# Patient Record
Sex: Female | Born: 1950 | Race: Black or African American | Hispanic: No | Smoking: Never smoker
Health system: Southern US, Community
[De-identification: ages and names within clinical notes are randomized; demographics above are authoritative.]

## PROBLEM LIST (undated history)

## (undated) DIAGNOSIS — I10 Essential (primary) hypertension: Secondary | ICD-10-CM

## (undated) DIAGNOSIS — G459 Transient cerebral ischemic attack, unspecified: Secondary | ICD-10-CM

## (undated) DIAGNOSIS — E785 Hyperlipidemia, unspecified: Secondary | ICD-10-CM

## (undated) DIAGNOSIS — H269 Unspecified cataract: Secondary | ICD-10-CM

## (undated) DIAGNOSIS — I5030 Unspecified diastolic (congestive) heart failure: Secondary | ICD-10-CM

## (undated) DIAGNOSIS — E119 Type 2 diabetes mellitus without complications: Secondary | ICD-10-CM

## (undated) HISTORY — PX: ABDOMINAL HYSTERECTOMY: SHX81

## (undated) HISTORY — DX: Essential (primary) hypertension: I10

## (undated) HISTORY — DX: Unspecified diastolic (congestive) heart failure: I50.30

## (undated) HISTORY — PX: EYE SURGERY: SHX253

## (undated) HISTORY — DX: Hyperlipidemia, unspecified: E78.5

## (undated) HISTORY — DX: Transient cerebral ischemic attack, unspecified: G45.9

## (undated) HISTORY — DX: Unspecified cataract: H26.9

## (undated) HISTORY — DX: Type 2 diabetes mellitus without complications: E11.9

## (undated) HISTORY — PX: TUBAL LIGATION: SHX77

---

## 2014-10-10 LAB — HM DIABETES EYE EXAM

## 2015-12-01 ENCOUNTER — Ambulatory Visit: Payer: Self-pay | Admitting: Pediatrics

## 2015-12-02 DIAGNOSIS — J810 Acute pulmonary edema: Secondary | ICD-10-CM | POA: Diagnosis not present

## 2015-12-02 DIAGNOSIS — J181 Lobar pneumonia, unspecified organism: Secondary | ICD-10-CM | POA: Diagnosis not present

## 2015-12-02 DIAGNOSIS — R0602 Shortness of breath: Secondary | ICD-10-CM | POA: Diagnosis not present

## 2015-12-02 DIAGNOSIS — E1165 Type 2 diabetes mellitus with hyperglycemia: Secondary | ICD-10-CM | POA: Diagnosis not present

## 2015-12-02 DIAGNOSIS — I110 Hypertensive heart disease with heart failure: Secondary | ICD-10-CM | POA: Diagnosis not present

## 2015-12-02 DIAGNOSIS — R609 Edema, unspecified: Secondary | ICD-10-CM | POA: Diagnosis not present

## 2015-12-02 DIAGNOSIS — Z9119 Patient's noncompliance with other medical treatment and regimen: Secondary | ICD-10-CM | POA: Diagnosis not present

## 2015-12-02 DIAGNOSIS — R0902 Hypoxemia: Secondary | ICD-10-CM | POA: Diagnosis not present

## 2015-12-02 DIAGNOSIS — Z833 Family history of diabetes mellitus: Secondary | ICD-10-CM | POA: Diagnosis not present

## 2015-12-02 DIAGNOSIS — I5031 Acute diastolic (congestive) heart failure: Secondary | ICD-10-CM | POA: Diagnosis not present

## 2015-12-02 DIAGNOSIS — R062 Wheezing: Secondary | ICD-10-CM | POA: Diagnosis not present

## 2015-12-02 DIAGNOSIS — R0682 Tachypnea, not elsewhere classified: Secondary | ICD-10-CM | POA: Diagnosis not present

## 2015-12-03 DIAGNOSIS — R0989 Other specified symptoms and signs involving the circulatory and respiratory systems: Secondary | ICD-10-CM | POA: Diagnosis not present

## 2015-12-03 DIAGNOSIS — J810 Acute pulmonary edema: Secondary | ICD-10-CM | POA: Diagnosis not present

## 2015-12-03 DIAGNOSIS — I5031 Acute diastolic (congestive) heart failure: Secondary | ICD-10-CM | POA: Diagnosis not present

## 2015-12-03 DIAGNOSIS — I509 Heart failure, unspecified: Secondary | ICD-10-CM | POA: Diagnosis not present

## 2015-12-03 DIAGNOSIS — E1165 Type 2 diabetes mellitus with hyperglycemia: Secondary | ICD-10-CM | POA: Diagnosis not present

## 2015-12-03 DIAGNOSIS — Z833 Family history of diabetes mellitus: Secondary | ICD-10-CM | POA: Diagnosis not present

## 2015-12-03 DIAGNOSIS — Z9119 Patient's noncompliance with other medical treatment and regimen: Secondary | ICD-10-CM | POA: Diagnosis not present

## 2015-12-03 DIAGNOSIS — R0902 Hypoxemia: Secondary | ICD-10-CM | POA: Diagnosis present

## 2015-12-03 DIAGNOSIS — I110 Hypertensive heart disease with heart failure: Secondary | ICD-10-CM | POA: Diagnosis not present

## 2015-12-03 DIAGNOSIS — Z8249 Family history of ischemic heart disease and other diseases of the circulatory system: Secondary | ICD-10-CM | POA: Diagnosis not present

## 2015-12-08 DIAGNOSIS — Z5321 Procedure and treatment not carried out due to patient leaving prior to being seen by health care provider: Secondary | ICD-10-CM | POA: Diagnosis not present

## 2015-12-10 ENCOUNTER — Encounter: Payer: Self-pay | Admitting: Family Medicine

## 2015-12-10 ENCOUNTER — Ambulatory Visit (INDEPENDENT_AMBULATORY_CARE_PROVIDER_SITE_OTHER): Payer: Medicare Other | Admitting: Family Medicine

## 2015-12-10 VITALS — BP 145/87 | HR 84 | Temp 97.6°F | Ht 59.5 in | Wt 154.8 lb

## 2015-12-10 DIAGNOSIS — I5031 Acute diastolic (congestive) heart failure: Secondary | ICD-10-CM | POA: Insufficient documentation

## 2015-12-10 DIAGNOSIS — I10 Essential (primary) hypertension: Secondary | ICD-10-CM | POA: Diagnosis not present

## 2015-12-10 DIAGNOSIS — E1169 Type 2 diabetes mellitus with other specified complication: Secondary | ICD-10-CM

## 2015-12-10 DIAGNOSIS — J810 Acute pulmonary edema: Secondary | ICD-10-CM | POA: Diagnosis not present

## 2015-12-10 DIAGNOSIS — K219 Gastro-esophageal reflux disease without esophagitis: Secondary | ICD-10-CM | POA: Insufficient documentation

## 2015-12-10 DIAGNOSIS — E119 Type 2 diabetes mellitus without complications: Secondary | ICD-10-CM | POA: Insufficient documentation

## 2015-12-10 DIAGNOSIS — K2100 Gastro-esophageal reflux disease with esophagitis, without bleeding: Secondary | ICD-10-CM

## 2015-12-10 DIAGNOSIS — I152 Hypertension secondary to endocrine disorders: Secondary | ICD-10-CM | POA: Insufficient documentation

## 2015-12-10 DIAGNOSIS — E1159 Type 2 diabetes mellitus with other circulatory complications: Secondary | ICD-10-CM | POA: Insufficient documentation

## 2015-12-10 MED ORDER — RANITIDINE HCL 150 MG PO CAPS: 150.0000 mg | ORAL_CAPSULE | Freq: Two times a day (BID) | ORAL | 2 refills | Status: DC

## 2015-12-10 MED ORDER — AMLODIPINE BESYLATE 5 MG PO TABS: 5.0000 mg | ORAL_TABLET | Freq: Every day | ORAL | 3 refills | Status: DC

## 2015-12-10 MED ORDER — AMLODIPINE BESYLATE 5 MG PO TABS: 5.0000 mg | ORAL_TABLET | Freq: Every day | ORAL | 2 refills | Status: DC

## 2015-12-10 MED ORDER — FUROSEMIDE 20 MG PO TABS: 20.0000 mg | ORAL_TABLET | Freq: Every day | ORAL | 3 refills | Status: DC

## 2015-12-10 NOTE — Patient Instructions (Signed)
Use  Ranitidine  twice daily 30 minutes before meal. Over the counter for GERD

## 2015-12-10 NOTE — Progress Notes (Signed)
BP (!) 145/87 (BP Location: Left Arm, Cuff Size: Large)   Pulse 84   Temp 97.6 F (36.4 C) (Oral)   Ht 4' 11.5" (1.511 m)   Wt 154 lb 12.8 oz (70.2 kg)   BMI 30.74 kg/m    Subjective:    Patient ID: Christy Love, female    DOB: 04/17/1951, 65 y.o.   MRN: [id number]  HPI: Christy Love is a 65 y.o. female presenting on 12/10/2015 for Hospitalization Follow-up (patient was discharged with Lasix, K-Dur, Lantus, Norvasc and sliding scale insulin.  She could not start these medications because of cost.  ) and Gastroesophageal Reflux   HPI Hospital follow-up Patient is coming in for hospital follow-up for acute pulmonary edema and acute diastolic heart failure. She had never had either these before but she has been off of her blood pressure medications and diabetes medications and they have been out of control for at least the past 8 or 9 months. She went into the hospital with shortness of breath and swelling. In the hospital they diagnosed her with the pulmonary edema and diastolic heart failure and perform diuresis after which her breathing returned to baseline. They did an echocardiogram and showed that after the diuresis her heart was working pretty much normally and showed no more diastolic dysfunction. They also restarted her medications for diabetes and hypertension as they were out of control. Currently today she denies any shortness of breath or wheezing or coughing since she got out of the hospital. She was unable to pick up some of her medications because of cost but some of that is because they sent the name brand instead of the generic and we will send the generic for today.  GERD Patient got out of the hospital and has been having increased heartburn but this is something that she has chronically. She used to be on medications for it but is not currently. She denies any blood in her stool or nausea or vomiting. She thinks she used to be on Nexium previously but cannot  remember.  Relevant past medical, surgical, family and social history reviewed and updated as indicated. Interim medical history since our last visit reviewed. Allergies and medications reviewed and updated.  Review of Systems  Constitutional: Negative for chills and fever.  HENT: Negative for congestion, ear discharge and ear pain.   Eyes: Negative for redness and visual disturbance.  Respiratory: Negative for cough, chest tightness, shortness of breath and wheezing.   Cardiovascular: Negative for chest pain and leg swelling.  Genitourinary: Negative for difficulty urinating and dysuria.  Musculoskeletal: Negative for back pain and gait problem.  Skin: Negative for rash.  Neurological: Negative for dizziness, weakness, light-headedness, numbness and headaches.  Psychiatric/Behavioral: Negative for agitation and behavioral problems.  All other systems reviewed and are negative.   Per HPI unless specifically indicated above  Social History   Social History  . Marital status: Widowed    Spouse name: N/A  . Number of children: N/A  . Years of education: N/A   Occupational History  . Not on file.   Social History Main Topics  . Smoking status: Never Smoker  . Smokeless tobacco: Never Used  . Alcohol use Yes     Comment: occasional socially  . Drug use: No  . Sexual activity: No   Other Topics Concern  . Not on file   Social History Narrative  . No narrative on file    Past Surgical History:  Procedure Laterality Date  .  ABDOMINAL HYSTERECTOMY    . TUBAL LIGATION      Family History  Problem Relation Age of Onset  . Diabetes Mother   . Glaucoma Mother   . Dementia Mother   . Diabetes Father   . Heart disease Father   . Diabetes Sister   . Diabetes Brother       Medication List       Accurate as of 12/10/15 10:03 AM. Always use your most recent med list.          amLODipine 5 MG tablet Commonly known as:  NORVASC Take 1 tablet (5 mg total) by mouth  daily.   Biotin 10000 MCG Tabs Take 10,000 mcg by mouth daily.   furosemide 20 MG tablet Commonly known as:  LASIX Take 1 tablet (20 mg total) by mouth daily.   lisinopril 10 MG tablet Commonly known as:  PRINIVIL,ZESTRIL Take 10 mg by mouth daily.   metFORMIN 1000 MG tablet Commonly known as:  GLUCOPHAGE Take 1,000 mg by mouth 2 (two) times daily with a meal.   ranitidine 150 MG capsule Commonly known as:  ZANTAC Take 1 capsule (150 mg total) by mouth 2 (two) times daily.   VITAMIN B COMPLEX PO Take 1 tablet by mouth daily.          Objective:    BP (!) 145/87 (BP Location: Left Arm, Cuff Size: Large)   Pulse 84   Temp 97.6 F (36.4 C) (Oral)   Ht 4' 11.5" (1.511 m)   Wt 154 lb 12.8 oz (70.2 kg)   BMI 30.74 kg/m   Wt Readings from Last 3 Encounters:  12/10/15 154 lb 12.8 oz (70.2 kg)    Physical Exam  Constitutional: She is oriented to person, place, and time. She appears well-developed and well-nourished. No distress.  HENT:  Right Ear: External ear normal.  Left Ear: External ear normal.  Nose: Nose normal.  Mouth/Throat: Oropharynx is clear and moist. No oropharyngeal exudate.  Eyes: Conjunctivae and EOM are normal. Pupils are equal, round, and reactive to light.  Neck: Neck supple. No thyromegaly present.  Cardiovascular: Normal rate, regular rhythm, normal heart sounds and intact distal pulses.   No murmur heard. Pulmonary/Chest: Effort normal. No respiratory distress. She has no wheezes. She has rales (By basilar crackles at the very bottom).  Abdominal: Soft. Bowel sounds are normal. She exhibits no distension. There is no tenderness. There is no rebound and no guarding.  Musculoskeletal: Normal range of motion. She exhibits no edema or tenderness.  Lymphadenopathy:    She has no cervical adenopathy.  Neurological: She is alert and oriented to person, place, and time. Coordination normal.  Skin: Skin is warm and dry. No rash noted. She is not  diaphoretic.  Psychiatric: She has a normal mood and affect. Her behavior is normal.  Nursing note and vitals reviewed.   No results found for this or any previous visit.    Assessment & Plan:   Problem List Items Addressed This Visit      Cardiovascular and Mediastinum   Essential hypertension   Relevant Medications   lisinopril (PRINIVIL,ZESTRIL) 10 MG tablet   amLODipine (NORVASC) 5 MG tablet   furosemide (LASIX) 20 MG tablet   Other Relevant Orders   UXL24+MWNU   Acute diastolic heart failure (HCC)   Relevant Medications   lisinopril (PRINIVIL,ZESTRIL) 10 MG tablet   amLODipine (NORVASC) 5 MG tablet   furosemide (LASIX) 20 MG tablet   Other Relevant Orders  CMP14+EGFR     Digestive   GERD (gastroesophageal reflux disease)   Relevant Medications   ranitidine (ZANTAC) 150 MG capsule     Endocrine   Type 2 diabetes mellitus (HCC)   Relevant Medications   metFORMIN (GLUCOPHAGE) 1000 MG tablet   lisinopril (PRINIVIL,ZESTRIL) 10 MG tablet   Other Relevant Orders   Ambulatory referral to Ophthalmology   CMP14+EGFR    Other Visit Diagnoses    Acute pulmonary edema (HCC)    -  Primary   Relevant Medications   furosemide (LASIX) 20 MG tablet       Follow up plan: Return in about 2 weeks (around 12/24/2015), or if symptoms worsen or fail to improve, for Recheck breathing and hypertension and diabetes.  Caryl Pina, MD [facility] 12/10/2015, 10:03 AM

## 2015-12-11 LAB — CMP14+EGFR
ALT: 21 IU/L (ref 0–32)
AST: 18 IU/L (ref 0–40)
Albumin/Globulin Ratio: 1.3 (ref 1.2–2.2)
Albumin: 4.1 g/dL (ref 3.6–4.8)
Alkaline Phosphatase: 76 IU/L (ref 39–117)
BUN/Creatinine Ratio: 14 (ref 12–28)
BUN: 12 mg/dL (ref 8–27)
Bilirubin Total: <0.2 mg/dL (ref 0.0–1.2)
CALCIUM: 9.7 mg/dL (ref 8.7–10.3)
CO2: 19 mmol/L (ref 18–29)
CREATININE: 0.84 mg/dL (ref 0.57–1.00)
Chloride: 97 mmol/L (ref 96–106)
GFR calc Af Amer: 84 mL/min/{1.73_m2} (ref >59)
GFR, EST NON AFRICAN AMERICAN: 73 mL/min/{1.73_m2} (ref >59)
GLOBULIN, TOTAL: 3.2 g/dL (ref 1.5–4.5)
Glucose: 169 mg/dL — ABNORMAL HIGH (ref 65–99)
Potassium: 4.5 mmol/L (ref 3.5–5.2)
Sodium: 138 mmol/L (ref 134–144)
TOTAL PROTEIN: 7.3 g/dL (ref 6.0–8.5)

## 2015-12-15 ENCOUNTER — Telehealth: Payer: Self-pay | Admitting: Family Medicine

## 2015-12-23 ENCOUNTER — Encounter: Payer: Self-pay | Admitting: Family Medicine

## 2015-12-24 ENCOUNTER — Ambulatory Visit (INDEPENDENT_AMBULATORY_CARE_PROVIDER_SITE_OTHER): Payer: Medicare Other | Admitting: Family Medicine

## 2015-12-24 ENCOUNTER — Encounter: Payer: Self-pay | Admitting: Family Medicine

## 2015-12-24 VITALS — BP 133/89 | HR 91 | Temp 98.3°F | Ht 59.5 in | Wt 153.6 lb

## 2015-12-24 DIAGNOSIS — I5031 Acute diastolic (congestive) heart failure: Secondary | ICD-10-CM

## 2015-12-24 DIAGNOSIS — E1169 Type 2 diabetes mellitus with other specified complication: Secondary | ICD-10-CM | POA: Diagnosis not present

## 2015-12-24 DIAGNOSIS — K2100 Gastro-esophageal reflux disease with esophagitis, without bleeding: Secondary | ICD-10-CM

## 2015-12-24 DIAGNOSIS — I10 Essential (primary) hypertension: Secondary | ICD-10-CM

## 2015-12-24 LAB — BAYER DCA HB A1C WAIVED: HB A1C (BAYER DCA - WAIVED): 12.5 % — ABNORMAL HIGH (ref <7.0)

## 2015-12-24 MED ORDER — EMPAGLIFLOZIN 10 MG PO TABS: 10.0000 mg | ORAL_TABLET | Freq: Every day | ORAL | 2 refills | Status: DC

## 2015-12-24 MED ORDER — OMEPRAZOLE 20 MG PO CPDR: 20.0000 mg | DELAYED_RELEASE_CAPSULE | Freq: Every day | ORAL | 3 refills | Status: DC

## 2015-12-24 NOTE — Progress Notes (Signed)
BP 133/89 (BP Location: Left Arm, Patient Position: Sitting, Cuff Size: Normal)   Pulse 91   Temp 98.3 F (36.8 C) (Oral)   Ht 4' 11.5" (1.511 m)   Wt 153 lb 9.6 oz (69.7 kg)   BMI 30.50 kg/m    Subjective:    Patient ID: Christy Love, female    DOB: 05/14/1950, 65 y.o.   MRN: [id number]  HPI: Christy Love is a 66 y.o. female presenting on 12/24/2015 for Hypertension (followup); Diabetes; and Pulmonary Edema   HPI Hypertension follow-up Patient comes in for hypertension recheck today. Her blood pressure is 133/89. She is currently on Norvasc and lisinopril. Patient denies headaches, blurred vision, chest pains, shortness of breath, or weakness. Denies any side effects from medication and is content with current medication.   Type 2 diabetes recheck Patient comes in for recheck on her diabetes. She says her blood sugars are keeping closer to the low 200s and below and in the a.m. they have been closer to the 130s to 160s. Her hemoglobin A1c 1 month ago was 12.8 and today it is 12.5. She is currently on an ACE inhibitor. She is not currently on a statin. She has not yet seen an ophthalmologist this year.  GERD Patient has been on ranitidine 150 mg twice a day for the past couple weeks and does not feel like it's helping her with her acid reflux. She is still having early satiety and some nausea. She denies any blood in her stool. She denies any abdominal pain or pain radiating anywhere else.  Follow-up on breathing and acute heart failure from the hospital Patient is coming back for an early recheck on her breathing to make sure that nothing has returned from her acute heart failure visit. When she saw me last time she was not having more shortness of breath or swelling. On her return visit this time she is still not having any of that and still doing very well and that regards.  Relevant past medical, surgical, family and social history reviewed and updated as indicated.  Interim medical history since our last visit reviewed. Allergies and medications reviewed and updated.  Review of Systems  Constitutional: Negative for chills and fever.  HENT: Negative for congestion, ear discharge and ear pain.   Eyes: Negative for redness and visual disturbance.  Respiratory: Negative for cough, chest tightness, shortness of breath and wheezing.   Cardiovascular: Negative for chest pain, palpitations and leg swelling.  Gastrointestinal: Positive for nausea. Negative for abdominal pain, blood in stool, constipation, diarrhea and vomiting.  Genitourinary: Negative for difficulty urinating and dysuria.  Musculoskeletal: Negative for back pain and gait problem.  Skin: Negative for rash.  Neurological: Negative for light-headedness and headaches.  Psychiatric/Behavioral: Negative for agitation and behavioral problems.  All other systems reviewed and are negative.   Per HPI unless specifically indicated above     Medication List       Accurate as of 12/24/15  5:03 PM. Always use your most recent med list.          amLODipine 5 MG tablet Commonly known as:  NORVASC Take 1 tablet (5 mg total) by mouth daily.   Biotin 10000 MCG Tabs Take 10,000 mcg by mouth daily.   empagliflozin 10 MG Tabs tablet Commonly known as:  JARDIANCE Take 10 mg by mouth daily.   furosemide 20 MG tablet Commonly known as:  LASIX Take 1 tablet (20 mg total) by mouth daily.   lisinopril  10 MG tablet Commonly known as:  PRINIVIL,ZESTRIL Take 10 mg by mouth daily.   metFORMIN 1000 MG tablet Commonly known as:  GLUCOPHAGE Take 1,000 mg by mouth 2 (two) times daily with a meal.   omeprazole 20 MG capsule Commonly known as:  PRILOSEC Take 1 capsule (20 mg total) by mouth daily.   ranitidine 150 MG capsule Commonly known as:  ZANTAC Take 1 capsule (150 mg total) by mouth 2 (two) times daily.   VITAMIN B COMPLEX PO Take 1 tablet by mouth daily.          Objective:    BP  133/89 (BP Location: Left Arm, Patient Position: Sitting, Cuff Size: Normal)   Pulse 91   Temp 98.3 F (36.8 C) (Oral)   Ht 4' 11.5" (1.511 m)   Wt 153 lb 9.6 oz (69.7 kg)   BMI 30.50 kg/m   Wt Readings from Last 3 Encounters:  12/24/15 153 lb 9.6 oz (69.7 kg)  12/10/15 154 lb 12.8 oz (70.2 kg)    Physical Exam  Constitutional: She is oriented to person, place, and time. She appears well-developed and well-nourished. No distress.  Eyes: Conjunctivae are normal.  Neck: Neck supple. No thyromegaly present.  Cardiovascular: Normal rate, regular rhythm, normal heart sounds and intact distal pulses.   No murmur heard. Pulmonary/Chest: Effort normal and breath sounds normal. No respiratory distress. She has no wheezes. She has no rales.  Abdominal: Soft. Bowel sounds are normal. She exhibits no distension. There is no tenderness. There is no rebound and no guarding.  Musculoskeletal: Normal range of motion. She exhibits no edema or tenderness.  Lymphadenopathy:    She has no cervical adenopathy.  Neurological: She is alert and oriented to person, place, and time. Coordination normal.  Skin: Skin is warm and dry. No rash noted. She is not diaphoretic.  Psychiatric: She has a normal mood and affect. Her behavior is normal.  Nursing note and vitals reviewed.      Assessment & Plan:   Problem List Items Addressed This Visit      Cardiovascular and Mediastinum   Essential hypertension - Primary    Continue current medications      Relevant Orders   CMP14+EGFR (Completed)   Acute diastolic heart failure (HCC)    Symptoms resolved, we'll continue to monitor for now        Digestive   GERD (gastroesophageal reflux disease)   Relevant Medications   omeprazole (PRILOSEC) 20 MG capsule   Other Relevant Orders   Ambulatory referral to Gastroenterology     Endocrine   Type 2 diabetes mellitus ([place])    We'll try and add an SGLT 2 medication and see if patient can afford one of  them.      Relevant Medications   empagliflozin (JARDIANCE) 10 MG TABS tablet   canagliflozin (INVOKANA) 100 MG TABS tablet   Other Relevant Orders   Bayer DCA Hb A1c Waived (Completed)   Lipid panel (Completed)    Other Visit Diagnoses   None.      Follow up plan: Return in about 3 months (around 03/25/2016), or if symptoms worsen or fail to improve, for Recheck diabetes and hypertension.  Counseling provided for all of the vaccine components Orders Placed This Encounter  Procedures  . Bayer DCA Hb A1c Waived  . CMP14+EGFR  . Lipid panel    Caryl Pina, MD [facility] 12/24/2015, 5:03 PM

## 2015-12-25 LAB — CMP14+EGFR
ALK PHOS: 65 IU/L (ref 39–117)
ALT: 13 IU/L (ref 0–32)
AST: 14 IU/L (ref 0–40)
Albumin/Globulin Ratio: 1.6 (ref 1.2–2.2)
Albumin: 4.7 g/dL (ref 3.6–4.8)
BILIRUBIN TOTAL: 0.2 mg/dL (ref 0.0–1.2)
BUN / CREAT RATIO: 15 (ref 12–28)
BUN: 13 mg/dL (ref 8–27)
CHLORIDE: 102 mmol/L (ref 96–106)
CO2: 25 mmol/L (ref 18–29)
CREATININE: 0.88 mg/dL (ref 0.57–1.00)
Calcium: 10.1 mg/dL (ref 8.7–10.3)
GFR calc Af Amer: 80 mL/min/{1.73_m2} (ref >59)
GFR calc non Af Amer: 69 mL/min/{1.73_m2} (ref >59)
Globulin, Total: 2.9 g/dL (ref 1.5–4.5)
Glucose: 122 mg/dL — ABNORMAL HIGH (ref 65–99)
Potassium: 4.6 mmol/L (ref 3.5–5.2)
Sodium: 144 mmol/L (ref 134–144)
Total Protein: 7.6 g/dL (ref 6.0–8.5)

## 2015-12-25 LAB — LIPID PANEL
CHOLESTEROL TOTAL: 208 mg/dL — AB (ref 100–199)
Chol/HDL Ratio: 2.5 ratio units (ref 0.0–4.4)
HDL: 83 mg/dL (ref >39)
LDL CALC: 96 mg/dL (ref 0–99)
TRIGLYCERIDES: 143 mg/dL (ref 0–149)
VLDL CHOLESTEROL CAL: 29 mg/dL (ref 5–40)

## 2015-12-25 MED ORDER — CANAGLIFLOZIN 100 MG PO TABS: 100.0000 mg | ORAL_TABLET | Freq: Every day | ORAL | 2 refills | Status: DC

## 2015-12-27 NOTE — Assessment & Plan Note (Signed)
We'll try and add an SGLT 2 medication and see if patient can afford one of them.

## 2015-12-27 NOTE — Assessment & Plan Note (Signed)
Symptoms resolved, we'll continue to monitor for now

## 2015-12-27 NOTE — Assessment & Plan Note (Signed)
Continue current medications. 

## 2016-01-05 ENCOUNTER — Encounter: Payer: Self-pay | Admitting: Gastroenterology

## 2016-01-26 ENCOUNTER — Ambulatory Visit: Payer: Self-pay | Admitting: Gastroenterology

## 2016-02-23 ENCOUNTER — Telehealth: Payer: Self-pay | Admitting: Family Medicine

## 2016-02-24 NOTE — Telephone Encounter (Signed)
lmtcb

## 2016-03-07 ENCOUNTER — Telehealth: Payer: Self-pay | Admitting: Family Medicine

## 2016-03-07 NOTE — Telephone Encounter (Signed)
Notes faxed.

## 2016-03-28 ENCOUNTER — Ambulatory Visit: Payer: Medicare Other | Admitting: Family Medicine

## 2016-07-08 ENCOUNTER — Telehealth: Payer: Self-pay | Admitting: Pharmacist

## 2016-07-08 NOTE — Telephone Encounter (Signed)
Patient is due to have DM rechecked and to follow up with PCP.  Called to schedule appt but No Ans - left message for patient to call for appt.

## 2016-10-04 ENCOUNTER — Telehealth: Payer: Self-pay | Admitting: Family Medicine

## 2016-10-21 DIAGNOSIS — E1165 Type 2 diabetes mellitus with hyperglycemia: Secondary | ICD-10-CM | POA: Diagnosis not present

## 2016-10-21 DIAGNOSIS — Z9114 Patient's other noncompliance with medication regimen: Secondary | ICD-10-CM | POA: Diagnosis not present

## 2016-10-21 DIAGNOSIS — Z79899 Other long term (current) drug therapy: Secondary | ICD-10-CM | POA: Diagnosis not present

## 2016-10-21 DIAGNOSIS — R200 Anesthesia of skin: Secondary | ICD-10-CM | POA: Diagnosis not present

## 2016-10-21 DIAGNOSIS — R531 Weakness: Secondary | ICD-10-CM | POA: Diagnosis not present

## 2016-10-21 DIAGNOSIS — I639 Cerebral infarction, unspecified: Secondary | ICD-10-CM | POA: Diagnosis not present

## 2016-10-21 DIAGNOSIS — Z7984 Long term (current) use of oral hypoglycemic drugs: Secondary | ICD-10-CM | POA: Diagnosis not present

## 2016-10-21 DIAGNOSIS — I10 Essential (primary) hypertension: Secondary | ICD-10-CM | POA: Diagnosis not present

## 2016-10-23 DIAGNOSIS — R531 Weakness: Secondary | ICD-10-CM | POA: Diagnosis not present

## 2016-10-23 DIAGNOSIS — E119 Type 2 diabetes mellitus without complications: Secondary | ICD-10-CM | POA: Diagnosis not present

## 2016-10-23 DIAGNOSIS — I10 Essential (primary) hypertension: Secondary | ICD-10-CM | POA: Diagnosis not present

## 2016-10-23 DIAGNOSIS — Z7984 Long term (current) use of oral hypoglycemic drugs: Secondary | ICD-10-CM | POA: Diagnosis not present

## 2016-10-23 DIAGNOSIS — I160 Hypertensive urgency: Secondary | ICD-10-CM | POA: Diagnosis not present

## 2016-10-23 DIAGNOSIS — Z79899 Other long term (current) drug therapy: Secondary | ICD-10-CM | POA: Diagnosis not present

## 2016-10-23 DIAGNOSIS — Z7902 Long term (current) use of antithrombotics/antiplatelets: Secondary | ICD-10-CM | POA: Diagnosis not present

## 2016-10-23 DIAGNOSIS — R930 Abnormal findings on diagnostic imaging of skull and head, not elsewhere classified: Secondary | ICD-10-CM | POA: Diagnosis not present

## 2016-10-25 ENCOUNTER — Encounter: Payer: Self-pay | Admitting: Family Medicine

## 2016-10-25 ENCOUNTER — Ambulatory Visit (INDEPENDENT_AMBULATORY_CARE_PROVIDER_SITE_OTHER): Payer: PPO | Admitting: Family Medicine

## 2016-10-25 VITALS — BP 136/85 | HR 91 | Temp 97.6°F | Ht 59.5 in | Wt 152.0 lb

## 2016-10-25 DIAGNOSIS — I10 Essential (primary) hypertension: Secondary | ICD-10-CM

## 2016-10-25 DIAGNOSIS — Z8673 Personal history of transient ischemic attack (TIA), and cerebral infarction without residual deficits: Secondary | ICD-10-CM | POA: Diagnosis not present

## 2016-10-25 DIAGNOSIS — E1169 Type 2 diabetes mellitus with other specified complication: Secondary | ICD-10-CM

## 2016-10-25 DIAGNOSIS — Z1159 Encounter for screening for other viral diseases: Secondary | ICD-10-CM | POA: Diagnosis not present

## 2016-10-25 DIAGNOSIS — Z780 Asymptomatic menopausal state: Secondary | ICD-10-CM

## 2016-10-25 LAB — BAYER DCA HB A1C WAIVED: HB A1C (BAYER DCA - WAIVED): 10.2 % — ABNORMAL HIGH (ref <7.0)

## 2016-10-25 MED ORDER — LISINOPRIL 10 MG PO TABS: 10.0000 mg | ORAL_TABLET | Freq: Every day | ORAL | 1 refills | Status: DC

## 2016-10-25 MED ORDER — METFORMIN HCL 1000 MG PO TABS: 1000.0000 mg | ORAL_TABLET | Freq: Two times a day (BID) | ORAL | 3 refills | Status: DC

## 2016-10-25 MED ORDER — AMLODIPINE BESYLATE 5 MG PO TABS: 5.0000 mg | ORAL_TABLET | Freq: Every day | ORAL | 3 refills | Status: DC

## 2016-10-25 MED ORDER — EMPAGLIFLOZIN 10 MG PO TABS: 10.0000 mg | ORAL_TABLET | Freq: Every day | ORAL | 2 refills | Status: DC

## 2016-10-25 MED ORDER — AMLODIPINE BESYLATE 5 MG PO TABS: 5.0000 mg | ORAL_TABLET | Freq: Every day | ORAL | 1 refills | Status: DC

## 2016-10-25 MED ORDER — CLOPIDOGREL BISULFATE 75 MG PO TABS: 75.0000 mg | ORAL_TABLET | Freq: Every day | ORAL | 3 refills | Status: DC

## 2016-10-25 NOTE — Progress Notes (Signed)
BP 136/85   Pulse 91   Temp 97.6 F (36.4 C) (Oral)   Ht 4' 11.5" (1.511 m)   Wt 152 lb (68.9 kg)   BMI 30.19 kg/m    Subjective:    Patient ID: Christy Love, female    DOB: Apr 29, 1951, 66 y.o.   MRN: [id number]  HPI: Christy Love is a 66 y.o. female presenting on 10/25/2016 for ER followup (2 visits last week, stroke like symptoms; patient had not been taking any medications since September, started them again last Wednesday)   HPI Hospital/ER follow-up for TIAs Patient is coming in for hospital/ER follow-up for recurrent TIAs over the past week. She thinks she has had about 6 different episodes where she had numbness and weakness on the left side of her body felt like she is dragging her left foot and some dizziness associated with it. She went in 4 days ago negative CT scan and did not find anything and her symptoms had completely cleared after 1 minute after beginning. Then she went in again yesterday and they found the same findings. We are requesting those records. She does admit that she had gone off her medications for diabetes and blood pressure and her blood pressure was 200/120 and higher in the emergency department. They restarted her on her medications which she is taking over the past 4 days. She has not had any episodes of this today. They did start her on Plavix as well. She is coming with [place] to reestablish for hypertension and diabetes as well as we will check to see where those are. Patient is also due for a DEXA scan today. During the TIA she also had some visual disturbances well but SYMPTOMS are completely normal about 1 minute after each time that it happened.  Relevant past medical, surgical, family and social history reviewed and updated as indicated. Interim medical history since our last visit reviewed. Allergies and medications reviewed and updated.  Review of Systems  Constitutional: Negative for chills and fever.  HENT: Negative for congestion, ear  discharge and ear pain.   Eyes: Positive for visual disturbance. Negative for redness.  Respiratory: Negative for chest tightness and shortness of breath.   Cardiovascular: Negative for chest pain and leg swelling.  Genitourinary: Negative for difficulty urinating and dysuria.  Musculoskeletal: Negative for back pain and gait problem.  Skin: Negative for rash.  Neurological: Positive for weakness and numbness. Negative for dizziness, light-headedness and headaches.  Psychiatric/Behavioral: Negative for agitation and behavioral problems.  All other systems reviewed and are negative.   Per HPI unless specifically indicated above   Allergies as of 10/25/2016   No Known Allergies     Medication List       Accurate as of 10/25/16  1:52 PM. Always use your most recent med list.          amLODipine 5 MG tablet Commonly known as:  NORVASC Take 1 tablet (5 mg total) by mouth daily.   Biotin 10000 MCG Tabs Take 10,000 mcg by mouth daily.   clopidogrel 75 MG tablet Commonly known as:  PLAVIX Take 1 tablet (75 mg total) by mouth daily.   empagliflozin 10 MG Tabs tablet Commonly known as:  JARDIANCE Take 10 mg by mouth daily.   lisinopril 10 MG tablet Commonly known as:  PRINIVIL,ZESTRIL Take 1 tablet (10 mg total) by mouth daily.   metFORMIN 1000 MG tablet Commonly known as:  GLUCOPHAGE Take 1 tablet (1,000 mg total) by mouth  2 (two) times daily with a meal.   VITAMIN B COMPLEX PO Take 1 tablet by mouth daily.          Objective:    BP 136/85   Pulse 91   Temp 97.6 F (36.4 C) (Oral)   Ht 4' 11.5" (1.511 m)   Wt 152 lb (68.9 kg)   BMI 30.19 kg/m   Wt Readings from Last 3 Encounters:  10/25/16 152 lb (68.9 kg)  12/24/15 153 lb 9.6 oz (69.7 kg)  12/10/15 154 lb 12.8 oz (70.2 kg)    Physical Exam  Constitutional: She is oriented to person, place, and time. She appears well-developed and well-nourished. No distress.  HENT:  Right Ear: External ear normal.    Left Ear: External ear normal.  Nose: Nose normal.  Mouth/Throat: Oropharynx is clear and moist.  Eyes: Conjunctivae and EOM are normal. Pupils are equal, round, and reactive to light. Right eye exhibits no discharge. Left eye exhibits no discharge. No scleral icterus.  Neck: Neck supple. No thyromegaly present.  Cardiovascular: Normal rate, regular rhythm, normal heart sounds and intact distal pulses.   No murmur heard. Pulmonary/Chest: Effort normal and breath sounds normal. No respiratory distress. She has no wheezes. She has no rales.  Musculoskeletal: Normal range of motion. She exhibits no edema or tenderness.  Lymphadenopathy:    She has no cervical adenopathy.  Neurological: She is alert and oriented to person, place, and time. She displays no atrophy and normal reflexes. No cranial nerve deficit or sensory deficit. She exhibits normal muscle tone. Coordination and gait normal.  Skin: Skin is warm and dry. No rash noted. She is not diaphoretic.  Psychiatric: She has a normal mood and affect. Her behavior is normal.  Nursing note and vitals reviewed.       Assessment & Plan:   Problem List Items Addressed This Visit      Cardiovascular and Mediastinum   Essential hypertension   Relevant Medications   lisinopril (PRINIVIL,ZESTRIL) 10 MG tablet   amLODipine (NORVASC) 5 MG tablet   Other Relevant Orders   CMP14+EGFR   Microalbumin / creatinine urine ratio     Endocrine   Type 2 diabetes mellitus (HCC) - Primary   Relevant Medications   empagliflozin (JARDIANCE) 10 MG TABS tablet   lisinopril (PRINIVIL,ZESTRIL) 10 MG tablet   metFORMIN (GLUCOPHAGE) 1000 MG tablet   Other Relevant Orders   Bayer DCA Hb A1c Waived   CMP14+EGFR   Ambulatory referral to Ophthalmology   Microalbumin / creatinine urine ratio    Other Visit Diagnoses    History of TIA (transient ischemic attack)       Relevant Medications   clopidogrel (PLAVIX) 75 MG tablet   Other Relevant Orders    Lipid panel   Need for hepatitis C screening test       Relevant Orders   Hepatitis C antibody   Postmenopausal       Relevant Orders   DG Bone Density       Follow up plan: Return in about 4 weeks (around 11/22/2016), or if symptoms worsen or fail to improve, for Return to me in 4 weeks, see Tammy in the meantime hopefully in a week or 2..  Counseling provided for all of the vaccine components Orders Placed This Encounter  Procedures  . DG Bone Density  . Bayer DCA Hb A1c Waived  . CMP14+EGFR  . Lipid panel  . Hepatitis C antibody  . Microalbumin / creatinine urine ratio  .  Ambulatory referral to Ophthalmology    Caryl Pina, MD [facility] 10/25/2016, 1:52 PM

## 2016-10-26 LAB — LIPID PANEL
CHOL/HDL RATIO: 3.3 ratio (ref 0.0–4.4)
Cholesterol, Total: 182 mg/dL (ref 100–199)
HDL: 55 mg/dL (ref >39)
LDL Calculated: 90 mg/dL (ref 0–99)
TRIGLYCERIDES: 187 mg/dL — AB (ref 0–149)
VLDL Cholesterol Cal: 37 mg/dL (ref 5–40)

## 2016-10-26 LAB — MICROALBUMIN / CREATININE URINE RATIO
CREATININE, UR: 85.4 mg/dL
MICROALB/CREAT RATIO: 12.2 mg/g{creat} (ref 0.0–30.0)
MICROALBUM., U, RANDOM: 10.4 ug/mL

## 2016-10-26 LAB — CMP14+EGFR
A/G RATIO: 1.4 (ref 1.2–2.2)
ALT: 16 IU/L (ref 0–32)
AST: 17 IU/L (ref 0–40)
Albumin: 4.7 g/dL (ref 3.6–4.8)
Alkaline Phosphatase: 72 IU/L (ref 39–117)
BUN/Creatinine Ratio: 16 (ref 12–28)
BUN: 17 mg/dL (ref 8–27)
Bilirubin Total: <0.2 mg/dL (ref 0.0–1.2)
CALCIUM: 10.2 mg/dL (ref 8.7–10.3)
CO2: 22 mmol/L (ref 20–29)
Chloride: 100 mmol/L (ref 96–106)
Creatinine, Ser: 1.09 mg/dL — ABNORMAL HIGH (ref 0.57–1.00)
GFR calc Af Amer: 61 mL/min/{1.73_m2} (ref >59)
GFR, EST NON AFRICAN AMERICAN: 53 mL/min/{1.73_m2} — AB (ref >59)
GLOBULIN, TOTAL: 3.3 g/dL (ref 1.5–4.5)
Glucose: 128 mg/dL — ABNORMAL HIGH (ref 65–99)
POTASSIUM: 4 mmol/L (ref 3.5–5.2)
SODIUM: 141 mmol/L (ref 134–144)
Total Protein: 8 g/dL (ref 6.0–8.5)

## 2016-10-26 LAB — HEPATITIS C ANTIBODY

## 2016-10-26 NOTE — Progress Notes (Signed)
Patient aware.

## 2016-11-23 ENCOUNTER — Encounter: Payer: Self-pay | Admitting: Family Medicine

## 2016-11-23 ENCOUNTER — Ambulatory Visit (INDEPENDENT_AMBULATORY_CARE_PROVIDER_SITE_OTHER): Payer: PPO | Admitting: Family Medicine

## 2016-11-23 VITALS — BP 146/80 | HR 75 | Temp 97.8°F | Ht 59.5 in | Wt 149.0 lb

## 2016-11-23 DIAGNOSIS — E1169 Type 2 diabetes mellitus with other specified complication: Secondary | ICD-10-CM | POA: Diagnosis not present

## 2016-11-23 DIAGNOSIS — I10 Essential (primary) hypertension: Secondary | ICD-10-CM | POA: Diagnosis not present

## 2016-11-23 DIAGNOSIS — Z8673 Personal history of transient ischemic attack (TIA), and cerebral infarction without residual deficits: Secondary | ICD-10-CM

## 2016-11-23 NOTE — Progress Notes (Signed)
BP (!) 146/80   Pulse 75   Temp 97.8 F (36.6 C) (Oral)   Ht 4' 11.5" (1.511 m)   Wt 149 lb (67.6 kg)   BMI 29.59 kg/m    Subjective:    Patient ID: Christy Love, female    DOB: 06/16/1950, 66 y.o.   MRN: [id number]  HPI: Christy Love is a 66 y.o. female presenting on 11/23/2016 for Diabetes (followup) and Transient Ischemic Attack (followup)   HPI Type 2 diabetes mellitus Patient comes in today for recheck of his diabetes. Patient has been currently taking Metformin 1000 twice a day and Jardiance, her blood sugars have been improved and now running between the 140s and low 200s. Patient is currently on an ACE inhibitor/ARB. Patient has not seen an ophthalmologist this year. Patient denies any issues with their feet.   Hypertension Patient is currently on lisinopril 10 and amlodipine 5, and their blood pressure today is 153/84 and repeat was 146/80, at home she is running anywhere between 110s and 160s over 70s to 90s. Patient denies any lightheadedness or dizziness. Patient denies headaches, blurred vision, chest pains, shortness of breath, or weakness. Denies any side effects from medication and is content with current medication. She was feeling fatigued in the mornings so she is split and is taking her lisinopril the morning and amlodipine in the evening, she just started that 2 days ago so she has not noticed some effect yet. She has not had any further TIA symptoms or TIA events since before she was seen last time.   Relevant past medical, surgical, family and social history reviewed and updated as indicated. Interim medical history since our last visit reviewed. Allergies and medications reviewed and updated.  Review of Systems  Constitutional: Negative for chills and fever.  Eyes: Negative for redness and visual disturbance.  Respiratory: Negative for chest tightness and shortness of breath.   Cardiovascular: Negative for chest pain and leg swelling.    Musculoskeletal: Negative for back pain and gait problem.  Skin: Negative for color change and rash.  Neurological: Negative for dizziness, weakness, light-headedness, numbness and headaches.  Psychiatric/Behavioral: Negative for agitation and behavioral problems.  All other systems reviewed and are negative.   Per HPI unless specifically indicated above        Objective:    BP (!) 153/84   Pulse 75   Temp 97.8 F (36.6 C) (Oral)   Ht 4' 11.5" (1.511 m)   Wt 149 lb (67.6 kg)   BMI 29.59 kg/m   Wt Readings from Last 3 Encounters:  11/23/16 149 lb (67.6 kg)  10/25/16 152 lb (68.9 kg)  12/24/15 153 lb 9.6 oz (69.7 kg)    Physical Exam  Constitutional: She is oriented to person, place, and time. She appears well-developed and well-nourished. No distress.  Eyes: Conjunctivae are normal.  Neck: Neck supple. No thyromegaly present.  Cardiovascular: Normal rate, regular rhythm, normal heart sounds and intact distal pulses.   No murmur heard. Pulmonary/Chest: Effort normal and breath sounds normal. No respiratory distress. She has no wheezes. She has no rales.  Musculoskeletal: Normal range of motion. She exhibits no edema.  Lymphadenopathy:    She has no cervical adenopathy.  Neurological: She is alert and oriented to person, place, and time. Coordination normal.  Skin: Skin is warm and dry. No rash noted. She is not diaphoretic.  Psychiatric: She has a normal mood and affect. Her behavior is normal.  Nursing note and vitals reviewed.  Assessment & Plan:   Problem List Items Addressed This Visit      Cardiovascular and Mediastinum   Essential hypertension     Endocrine   Type 2 diabetes mellitus (HCC) - Primary     Other   History of TIA (transient ischemic attack)      Continue current medications and see back in 2 months  Follow up plan: Return in about 2 months (around 01/24/2017), or if symptoms worsen or fail to improve, for Diabetes and blood  pressure recheck.  Counseling provided for all of the vaccine components No orders of the defined types were placed in this encounter.   Arville CareJoshua Dettinger, MD [facility] 11/23/2016, 2:28 PM

## 2016-12-19 DIAGNOSIS — H25813 Combined forms of age-related cataract, bilateral: Secondary | ICD-10-CM | POA: Diagnosis not present

## 2016-12-19 DIAGNOSIS — H25013 Cortical age-related cataract, bilateral: Secondary | ICD-10-CM | POA: Diagnosis not present

## 2016-12-19 DIAGNOSIS — E113393 Type 2 diabetes mellitus with moderate nonproliferative diabetic retinopathy without macular edema, bilateral: Secondary | ICD-10-CM | POA: Diagnosis not present

## 2016-12-19 DIAGNOSIS — I10 Essential (primary) hypertension: Secondary | ICD-10-CM | POA: Diagnosis not present

## 2016-12-19 DIAGNOSIS — H2513 Age-related nuclear cataract, bilateral: Secondary | ICD-10-CM | POA: Diagnosis not present

## 2016-12-19 DIAGNOSIS — H35033 Hypertensive retinopathy, bilateral: Secondary | ICD-10-CM | POA: Diagnosis not present

## 2016-12-19 DIAGNOSIS — Z7984 Long term (current) use of oral hypoglycemic drugs: Secondary | ICD-10-CM | POA: Diagnosis not present

## 2016-12-19 DIAGNOSIS — H11153 Pinguecula, bilateral: Secondary | ICD-10-CM | POA: Diagnosis not present

## 2016-12-19 DIAGNOSIS — H524 Presbyopia: Secondary | ICD-10-CM | POA: Diagnosis not present

## 2016-12-19 LAB — HM DIABETES EYE EXAM

## 2017-01-06 ENCOUNTER — Other Ambulatory Visit: Payer: Self-pay

## 2017-01-06 DIAGNOSIS — E1169 Type 2 diabetes mellitus with other specified complication: Secondary | ICD-10-CM

## 2017-01-06 MED ORDER — EMPAGLIFLOZIN 10 MG PO TABS: 10.0000 mg | ORAL_TABLET | Freq: Every day | ORAL | 0 refills | Status: DC

## 2017-01-27 DIAGNOSIS — Z7984 Long term (current) use of oral hypoglycemic drugs: Secondary | ICD-10-CM | POA: Diagnosis not present

## 2017-01-27 DIAGNOSIS — E119 Type 2 diabetes mellitus without complications: Secondary | ICD-10-CM | POA: Diagnosis not present

## 2017-01-27 DIAGNOSIS — I10 Essential (primary) hypertension: Secondary | ICD-10-CM | POA: Diagnosis not present

## 2017-01-27 DIAGNOSIS — R531 Weakness: Secondary | ICD-10-CM | POA: Diagnosis not present

## 2017-01-27 DIAGNOSIS — Z7902 Long term (current) use of antithrombotics/antiplatelets: Secondary | ICD-10-CM | POA: Diagnosis not present

## 2017-01-27 DIAGNOSIS — Z79899 Other long term (current) drug therapy: Secondary | ICD-10-CM | POA: Diagnosis not present

## 2017-01-27 DIAGNOSIS — G8191 Hemiplegia, unspecified affecting right dominant side: Secondary | ICD-10-CM | POA: Diagnosis not present

## 2017-01-27 DIAGNOSIS — I639 Cerebral infarction, unspecified: Secondary | ICD-10-CM | POA: Diagnosis not present

## 2017-02-03 ENCOUNTER — Ambulatory Visit (INDEPENDENT_AMBULATORY_CARE_PROVIDER_SITE_OTHER): Payer: PPO | Admitting: Family Medicine

## 2017-02-03 ENCOUNTER — Ambulatory Visit: Payer: PPO | Admitting: Family Medicine

## 2017-02-03 ENCOUNTER — Encounter: Payer: Self-pay | Admitting: Family Medicine

## 2017-02-03 VITALS — BP 134/86 | HR 81 | Temp 97.9°F | Ht 59.5 in | Wt 151.0 lb

## 2017-02-03 DIAGNOSIS — G459 Transient cerebral ischemic attack, unspecified: Secondary | ICD-10-CM | POA: Diagnosis not present

## 2017-02-03 NOTE — Progress Notes (Signed)
BP 134/86   Pulse 81   Temp 97.9 F (36.6 C) (Oral)   Ht 4' 11.5" (1.511 m)   Wt 151 lb (68.5 kg)   BMI 29.99 kg/m    Subjective:    Patient ID: Christy Love, female    DOB: 1950-05-18, 66 y.o.   MRN: [id number]  HPI: Christy Love is a 66 y.o. female presenting on 02/03/2017 for ER followup (on 01/27/17 )   HPI ER follow-up for TIA Patient is coming in today for an ER follow-up for TIA. This is her second time she's had an event like this. She was in the ER on 01/27/2017. They did a CT head and did not find anything except small vessel ischemic changes but based on her symptoms of right-sided numbness and weakness which was transient and then went away they called it another TIA. After the last time she never did see her neurologist this point. Today she feels like her strength is pretty much back to normal and denies any numbness or weakness. She denies any chest pain or vision troubles or lightheadedness or dizziness  Relevant past medical, surgical, family and social history reviewed and updated as indicated. Interim medical history since our last visit reviewed. Allergies and medications reviewed and updated.  Review of Systems  Constitutional: Negative for chills and fever.  Eyes: Negative for visual disturbance.  Respiratory: Negative for chest tightness and shortness of breath.   Cardiovascular: Negative for chest pain and leg swelling.  Genitourinary: Negative for difficulty urinating and dysuria.  Musculoskeletal: Negative for back pain and gait problem.  Skin: Negative for rash.  Neurological: Positive for numbness. Negative for dizziness, light-headedness and headaches.  Psychiatric/Behavioral: Negative for agitation and behavioral problems.  All other systems reviewed and are negative.   Per HPI unless specifically indicated above        Objective:    BP 134/86   Pulse 81   Temp 97.9 F (36.6 C) (Oral)   Ht 4' 11.5" (1.511 m)   Wt 151 lb (68.5  kg)   BMI 29.99 kg/m   Wt Readings from Last 3 Encounters:  02/03/17 151 lb (68.5 kg)  11/23/16 149 lb (67.6 kg)  10/25/16 152 lb (68.9 kg)    Physical Exam  Constitutional: She is oriented to person, place, and time. She appears well-developed and well-nourished. No distress.  Eyes: Conjunctivae are normal.  Cardiovascular: Normal rate, regular rhythm, normal heart sounds and intact distal pulses.   No murmur heard. Pulmonary/Chest: Effort normal and breath sounds normal. No respiratory distress. She has no wheezes.  Musculoskeletal: Normal range of motion. She exhibits no edema or tenderness.  Neurological: She is alert and oriented to person, place, and time. She has normal reflexes. She displays no tremor. No cranial nerve deficit or sensory deficit. She exhibits normal muscle tone. She displays no seizure activity. Coordination normal.  Skin: Skin is warm and dry. No rash noted. She is not diaphoretic.  Psychiatric: She has a normal mood and affect. Her behavior is normal.  Nursing note and vitals reviewed.   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  Counseling provided for all of the vaccine components Orders Placed This Encounter  Procedures  . US Carotid Duplex Bilateral  . MR Brain Wo Contrast  . ECHOCARDIOGRAM COMPLETE    Arville Care, MD [facility] 02/03/2017, 2:42 PM

## 2017-02-09 ENCOUNTER — Ambulatory Visit (HOSPITAL_COMMUNITY)
Admission: RE | Admit: 2017-02-09 | Discharge: 2017-02-09 | Disposition: A | Discharge status: Home / Self Care | Payer: PPO | Source: Ambulatory Visit | Attending: Family Medicine | Admitting: Family Medicine

## 2017-02-09 DIAGNOSIS — I119 Hypertensive heart disease without heart failure: Secondary | ICD-10-CM | POA: Diagnosis not present

## 2017-02-09 DIAGNOSIS — K219 Gastro-esophageal reflux disease without esophagitis: Secondary | ICD-10-CM | POA: Insufficient documentation

## 2017-02-09 DIAGNOSIS — G459 Transient cerebral ischemic attack, unspecified: Secondary | ICD-10-CM | POA: Diagnosis not present

## 2017-02-09 DIAGNOSIS — E119 Type 2 diabetes mellitus without complications: Secondary | ICD-10-CM | POA: Insufficient documentation

## 2017-02-09 DIAGNOSIS — I071 Rheumatic tricuspid insufficiency: Secondary | ICD-10-CM | POA: Diagnosis not present

## 2017-02-09 NOTE — Progress Notes (Signed)
*  PRELIMINARY RESULTS* Echocardiogram 2D Echocardiogram has been performed.  Christy Love 02/09/2017, 12:52 PM

## 2017-02-13 ENCOUNTER — Ambulatory Visit (INDEPENDENT_AMBULATORY_CARE_PROVIDER_SITE_OTHER): Payer: PPO | Admitting: *Deleted

## 2017-02-13 ENCOUNTER — Encounter: Payer: Self-pay | Admitting: *Deleted

## 2017-02-13 VITALS — BP 130/80 | HR 73 | Ht 59.5 in | Wt 137.0 lb

## 2017-02-13 DIAGNOSIS — Z0000 Encounter for general adult medical examination without abnormal findings: Secondary | ICD-10-CM | POA: Diagnosis not present

## 2017-02-13 DIAGNOSIS — G459 Transient cerebral ischemic attack, unspecified: Secondary | ICD-10-CM

## 2017-02-13 NOTE — Progress Notes (Signed)
Discussed with patient

## 2017-02-13 NOTE — Progress Notes (Signed)
Subjective:   Christy Love is a 66 y.o. female who presents for an Initial Medicare Annual Wellness Visit. Christy Love lives at home alone and has a son that lives in [place]. She will be moving to [place] to be closer to him in November. She has had a couple of TIAs recently and followed up with Dr Museum/gallery exhibitions officer. Her echo didn't show any cardiac cause for TIA and she cancelled her carotid dopplers and MRI due to potential costs. She has reconsidered and would like to proceed with those. She was also to f/u with neurology but [facility] was unable to get her in until November and she needs to be seen sooner than that.   Review of Systems    Neurology: Intermittent periods of difficulty with speech. Has noticed that her speech is different at times and that she has difficulty expressing herself. Words won't come to her. She is also having intermittent trouble with raising her right leg/foot. Sometimes she can raise with no trouble and then other times it requires a great amount of effort and she can barely lift it.   Cardiac Risk Factors include: advanced age (>31men, >54 women);hypertension;diabetes mellitus;sedentary lifestyle    Other systems negative today.  Objective:    Today's Vitals   02/14/17 0859  BP: 130/80  Pulse: 73  Weight: 137 lb (62.1 kg)  Height: 4' 11.5" (1.511 m)   Body mass index is 27.21 kg/m.   Current Medications (verified) Outpatient Encounter Prescriptions as of 02/13/2017  Medication Sig  . amLODipine (NORVASC) 5 MG tablet Take 1 tablet (5 mg total) by mouth daily.  Marland Kitchen aspirin 120 MG suppository Place 120 mg rectally every 6 (six) hours as needed for fever.  Marland Kitchen aspirin 81 MG chewable tablet Chew by mouth daily.  . B Complex Vitamins (VITAMIN B COMPLEX PO) Take 1 tablet by mouth daily.  . clopidogrel (PLAVIX) 75 MG tablet Take 1 tablet (75 mg total) by mouth daily.  . empagliflozin (JARDIANCE) 10 MG TABS tablet Take 10 mg by mouth daily.  Marland Kitchen lisinopril  (PRINIVIL,ZESTRIL) 10 MG tablet Take 1 tablet (10 mg total) by mouth daily.  . metFORMIN (GLUCOPHAGE) 1000 MG tablet Take 1 tablet (1,000 mg total) by mouth 2 (two) times daily with a meal.   No facility-administered encounter medications on file as of 02/13/2017.     Allergies (verified) Patient has no known allergies.   History: Past Medical History:  Diagnosis Date  . Diabetes mellitus without complication (HCC)   . Diastolic congestive heart failure (HCC)   . Hyperlipidemia   . Hypertension    Past Surgical History:  Procedure Laterality Date  . ABDOMINAL HYSTERECTOMY    . TUBAL LIGATION     Family History  Problem Relation Age of Onset  . Diabetes Mother   . Glaucoma Mother   . Dementia Mother   . Diabetes Father   . Heart disease Father   . Diabetes Sister   . Diabetes Brother    Social History   Occupational History  . Not on file.   Social History Main Topics  . Smoking status: Never Smoker  . Smokeless tobacco: Never Used  . Alcohol use Yes     Comment: occasional socially  . Drug use: No  . Sexual activity: No    Tobacco Counseling No tobacco use  Activities of Daily Living In your present state of health, do you have any difficulty performing the following activities: 02/13/2017  Hearing? N  Vision? Jeannie Fend  Comment cataracts  Difficulty concentrating or making decisions? N  Walking or climbing stairs? Y  Comment Having some trouble with raising right foot at times. Has a few steps into house and no rail but she braces onto the wall.   Dressing or bathing? N  Doing errands, shopping? N  Preparing Food and eating ? N  Using the Toilet? N  In the past six months, have you accidently leaked urine? Y  Comment Some urge incontinence but not frequently  Do you have problems with loss of bowel control? N  Managing your Medications? N  Managing your Finances? N  Housekeeping or managing your Housekeeping? N  Some recent data might be hidden     Immunizations and Health Maintenance  There is no immunization history on file for this patient. Health Maintenance Due  Topic Date Due  . FOOT EXAM  07/06/1960  . TETANUS/TDAP  07/06/1969  . MAMMOGRAM  07/06/2000  . COLONOSCOPY  07/06/2000  . DEXA SCAN  07/07/2015  . PNA vac Low Risk Adult (1 of 2 - PCV13) 07/07/2015    Patient Care Team: Dettinger, Elige Radon, MD as PCP - General (Family Medicine) [place] Bali, MD as Consulting Physician (Gastroenterology)  ER visits for TIAs (see notes in EMR). No hospitalizations or surgeries.      Assessment:   This is a routine wellness examination for Christy Love.   Hearing/Vision screen No exam data present  Dietary issues and exercise activities discussed: Current Exercise Habits: The patient does not participate in regular exercise at present, Exercise limited by: neurologic condition(s);orthopedic condition(s)   Diet: Loss of appetite since TIA  Goals    . Decrease the likelihood of falling          Move slowly to avoid falls. Consider using a cane in the left hand to help with balance and inability to raise right foot completely.    . Exercise 150 minutes per week (moderate activity)          Chair exercises daily. Refer to handout.       Depression Screen PHQ 2/9 Scores 02/13/2017 02/03/2017 11/23/2016 10/25/2016 12/24/2015 12/10/2015  PHQ - 2 Score 3 0 0 3 0 0  PHQ- 9 Score 10 - - 12 - -    Fall Risk Fall Risk  02/13/2017 12/24/2015 12/10/2015  Falls in the past year? Yes No No  Comment Slipped standing on tub removing curtain, and tripped on step with laundry basket - -  Number falls in past yr: 2 or more - -  Injury with Fall? No - -  Risk for fall due to : Impaired balance/gait;History of fall(s) - -  Follow up Falls prevention discussed - -    Cognitive Function: MMSE - Mini Mental State Exam 02/13/2017  Orientation to time 5  Orientation to Place 5  Registration 3  Attention/ Calculation 5  Recall 2   Language- name 2 objects 2  Language- repeat 1  Language- follow 3 step command 3  Language- read & follow direction 1  Write a sentence 1  Copy design 1  Total score 29    Normal Exam    Screening Tests Health Maintenance  Topic Date Due  . FOOT EXAM  07/06/1960  . TETANUS/TDAP  07/06/1969  . MAMMOGRAM  07/06/2000  . COLONOSCOPY  07/06/2000  . DEXA SCAN  07/07/2015  . PNA vac Low Risk Adult (1 of 2 - PCV13) 07/07/2015  . INFLUENZA VACCINE  07/31/2017 (Originally 11/30/2016)  .  HEMOGLOBIN A1C  04/26/2017  . OPHTHALMOLOGY EXAM  12/19/2017  . Hepatitis C Screening  Completed   Declined vaccination and other HM updates today.    Plan:  Handout given and reviewed on diets after stroke and booklet with information about diabetes and nutrition.  Move slowly to avoid falls.  Chair exercises daily. Handout given and explained.  Advance Directives for [facility] and [place] given and explained. I will request that her previously cancelled MRI and carotid dopplers be rescheduled and will try to get her in with a neurologist before she leaves for [place]. Keep f/u with PCP Consider mammogram, dexa scan, colon cancer screening  I have personally reviewed and noted the following in the patient's chart:   . Medical and social history . Use of alcohol, tobacco or illicit drugs  . Current medications and supplements . Functional ability and status . Nutritional status . Physical activity . Advanced directives . List of other physicians . Hospitalizations, surgeries, and ER visits in previous 12 months . Vitals . Screenings to include cognitive, depression, and falls . Referrals and appointments  In addition, I have reviewed and discussed with patient certain preventive protocols, quality metrics, and best practice recommendations. A written personalized care plan for preventive services as well as general preventive health recommendations were provided to patient.     Demetrios Loll, RN   02/14/2017

## 2017-02-14 ENCOUNTER — Ambulatory Visit (HOSPITAL_COMMUNITY): Payer: PPO

## 2017-02-20 NOTE — Patient Instructions (Signed)
  Ms. Christy Love , Thank you for taking time to come for your Medicare Wellness Visit. I appreciate your ongoing commitment to your health goals. Please review the following plan we discussed and let me know if I can assist you in the future.   These are the goals we discussed: Goals    . Decrease the likelihood of falling          Move slowly to avoid falls. Consider using a cane in the left hand to help with balance and inability to raise right foot completely.    . Exercise 150 minutes per week (moderate activity)          Chair exercises daily. Refer to handout.        This is a list of the screening recommended for you and due dates:  Health Maintenance  Topic Date Due  . Complete foot exam   07/06/1960  . Tetanus Vaccine  07/06/1969  . Mammogram  07/06/2000  . Colon Cancer Screening  07/06/2000  . DEXA scan (bone density measurement)  07/07/2015  . Pneumonia vaccines (1 of 2 - PCV13) 07/07/2015  . Flu Shot  07/31/2017*  . Hemoglobin A1C  04/26/2017  . Eye exam for diabetics  12/19/2017  .  Hepatitis C: One time screening is recommended by Center for Disease Control  (CDC) for  adults born from [birth year] through 1965.   Completed  *Topic was postponed. The date shown is not the original due date.

## 2017-02-21 ENCOUNTER — Ambulatory Visit (HOSPITAL_COMMUNITY)
Admission: RE | Admit: 2017-02-21 | Discharge: 2017-02-21 | Disposition: A | Discharge status: Home / Self Care | Payer: PPO | Source: Ambulatory Visit | Attending: Family Medicine | Admitting: Family Medicine

## 2017-02-21 DIAGNOSIS — G459 Transient cerebral ischemic attack, unspecified: Secondary | ICD-10-CM | POA: Diagnosis not present

## 2017-02-23 ENCOUNTER — Ambulatory Visit (INDEPENDENT_AMBULATORY_CARE_PROVIDER_SITE_OTHER): Payer: PPO | Admitting: Neurology

## 2017-02-23 ENCOUNTER — Encounter: Payer: Self-pay | Admitting: Neurology

## 2017-02-23 VITALS — BP 156/93 | HR 87 | Ht 59.5 in | Wt 141.0 lb

## 2017-02-23 DIAGNOSIS — E1169 Type 2 diabetes mellitus with other specified complication: Secondary | ICD-10-CM

## 2017-02-23 DIAGNOSIS — Z8673 Personal history of transient ischemic attack (TIA), and cerebral infarction without residual deficits: Secondary | ICD-10-CM

## 2017-02-23 NOTE — Progress Notes (Signed)
PATIENT: Christy Love DOB: 02/20/1951  Chief Complaint  Patient presents with  . Transient Ischemic Attack    [facility] with right, lower extremity weakness that started the prior day.  She also noted swelling in right leg and dragging of her foot when walking. Says she has experienced a similar episode in the past and was diagnosed with a TIA.  She takes Plavix 75mg  daily.  Since the most recent event, she is now noticing gait difficulty, slow speech and changes in her handwriting.  Marland Kitchen. PCP    Dettinger, Elige RadonJoshua A, MD - referred from [facility]     HISTORICAL  Christy Love is a 66 year old female, seen in refer by her primary care doctor  Dettinger, Ivin BootyJoshua for evaluation of TIA, initial evaluation was February 23 2017.  She has past medical history of hypertension, hyperlipidemia and diabetes since 2012, suboptimal control, she is not compliance with medications.  On January 26 2017 when she stepped out of the car, she noticed right leg weakness, tremor, could not bear weight, dragging her right leg when she walks,  Symptoms persistent she presented to the emergency room next day, I have reviewed and summarized emergency [facility] on September [facility] 2018, CT head without contrast small vessel disease left basal ganglion, right thalamus,  EKG was normal sinus rhythm, laboratory evaluation normal CMP, creatinine of 0.88, hemoglobin of 13.5,  Echocardiogram February 09 2017, ejection fraction 50-55%, Ultrasound of carotid artery showed no significant abnormality.  This is her first similar episode, initial episode was in June 2018, [place] [employer], has set onset bilateral hands tremor, difficulty holding her shopping cart, bilateral lower extremity tremor, almost fell, she was evaluated by local emergency room, was started on Plavix 75 mg daily,  She is now on double antiplatelet agent  Plavix and aspirin   REVIEW OF SYSTEMS: Full 14 system review of systems performed and notable only for weight loss, fatigue, numbness, weakness, slurred speech, decreased energy  ALLERGIES: No Known Allergies  HOME MEDICATIONS: Current Outpatient Prescriptions  Medication Sig Dispense Refill  . amLODipine (NORVASC) 5 MG tablet Take 1 tablet (5 mg total) by mouth daily. 90 tablet 1  . aspirin 120 MG suppository Place 120 mg rectally every 6 (six) hours as needed for fever.    Marland Kitchen. aspirin 81 MG chewable tablet Chew by mouth daily.    . B Complex Vitamins (VITAMIN B COMPLEX PO) Take 1 tablet by mouth daily.    . clopidogrel (PLAVIX) 75 MG tablet Take 1 tablet (75 mg total) by mouth daily. 30 tablet 3  . empagliflozin (JARDIANCE) 10 MG TABS tablet Take 10 mg by mouth daily. 90 tablet 0  . lisinopril (PRINIVIL,ZESTRIL) 10 MG tablet Take 1 tablet (10 mg total) by mouth daily. 30 tablet 1  . metFORMIN (GLUCOPHAGE) 1000 MG tablet Take 1 tablet (1,000 mg total) by mouth 2 (two) times daily with a meal. 60 tablet 3   No current facility-administered medications for this visit.     PAST MEDICAL HISTORY: Past Medical History:  Diagnosis Date  . Diabetes mellitus without complication (HCC)   . Diastolic congestive heart failure (HCC)   . Hyperlipidemia   . Hypertension   . TIA (transient ischemic attack)     PAST SURGICAL HISTORY: Past Surgical History:  Procedure Laterality Date  . ABDOMINAL HYSTERECTOMY    . TUBAL LIGATION  FAMILY HISTORY: Family History  Problem Relation Age of Onset  . Diabetes Mother   . Glaucoma Mother   . Dementia Mother   . Diabetes Father   . Heart disease Father   . Diabetes Sister   . Diabetes Brother     SOCIAL HISTORY:  Social History   Social History  . Marital status: Widowed    Spouse name: N/A  . Number of children: 1  . Years of education: Bachelors   Occupational History  . Retired    Social History Main Topics  . Smoking  status: Never Smoker  . Smokeless tobacco: Never Used  . Alcohol use Yes     Comment: occasional socially  . Drug use: No  . Sexual activity: No   Other Topics Concern  . Not on file   Social History Narrative   Lives at home alone.   Right-handed.   Drinks a soda on occasion.        PHYSICAL EXAM   Vitals:   02/23/17 1131  BP: (!) 156/93  Pulse: 87  Weight: 141 lb (64 kg)  Height: 4' 11.5" (1.511 m)    Not recorded      Body mass index is 28 kg/m.  PHYSICAL EXAMNIATION:  Gen: NAD, conversant, well nourised, obese, well groomed                     Cardiovascular: Regular rate rhythm, no peripheral edema, warm, nontender. Eyes: Conjunctivae clear without exudates or hemorrhage Neck: Supple, no carotid bruits. Pulmonary: Clear to auscultation bilaterally   NEUROLOGICAL EXAM:  MENTAL STATUS: Speech:    Speech is normal; fluent and spontaneous with normal comprehension.  Cognition:     Orientation to time, place and person     Normal recent and remote memory     Normal Attention span and concentration     Normal Language, naming, repeating,spontaneous speech     Fund of knowledge   CRANIAL NERVES: CN II: Visual fields are full to confrontation. Fundoscopic exam is normal with sharp discs and no vascular changes. Pupils are round equal and briskly reactive to light. CN III, IV, VI: extraocular movement are normal. No ptosis. CN V: Facial sensation is intact to pinprick in all 3 divisions bilaterally. Corneal responses are intact.  CN VII: Face is symmetric with normal eye closure and smile. CN VIII: Hearing is normal to rubbing fingers CN IX, X: Palate elevates symmetrically. Phonation is normal. CN XI: Head turning and shoulder shrug are intact CN XII: Tongue is midline with normal movements and no atrophy.  MOTOR: There is no pronator drift of out-stretched arms. Muscle bulk and tone are normal. Muscle strength is normal.  REFLEXES: Reflexes are 2+ and  symmetric at the biceps, triceps, knees, and ankles. Plantar responses are flexor.  SENSORY: Intact to light touch, pinprick, positional sensation and vibratory sensation are intact in fingers and toes.  COORDINATION: Rapid alternating movements and fine finger movements are intact. There is no dysmetria on finger-to-nose and heel-knee-shin.    GAIT/STANCE:  She needs push up to get up from seated position, mildly cautious, dragging her right leg Romberg is absent.   DIAGNOSTIC DATA (LABS, IMAGING, TESTING) - I reviewed patient records, labs, notes, testing and imaging myself where available.   ASSESSMENT AND PLAN  Christy Love is a 66 y.o. female    Acute onset right-sided weakness, mild residual gait abnormality  Vascular risk factor of hypertension, diabetes, hyperlipidemia  Possible TIA  involving left hemisphere  Complete evaluation with MRI of the brain  Continue Plavix daily  Levert Feinstein, M.D. Ph.D.  [facility] [address] [place], [place] [address] Ph: [phone] Fax: [phone]  CC: Dettinger, Elige Radon, MD

## 2017-02-26 ENCOUNTER — Other Ambulatory Visit: Payer: Self-pay | Admitting: Family Medicine

## 2017-02-26 DIAGNOSIS — Z8673 Personal history of transient ischemic attack (TIA), and cerebral infarction without residual deficits: Secondary | ICD-10-CM

## 2017-02-28 ENCOUNTER — Other Ambulatory Visit: Payer: Self-pay | Admitting: *Deleted

## 2017-02-28 DIAGNOSIS — E1169 Type 2 diabetes mellitus with other specified complication: Secondary | ICD-10-CM

## 2017-02-28 DIAGNOSIS — I10 Essential (primary) hypertension: Secondary | ICD-10-CM

## 2017-02-28 MED ORDER — LISINOPRIL 10 MG PO TABS: 10.0000 mg | ORAL_TABLET | Freq: Every day | ORAL | 1 refills | Status: DC

## 2017-03-01 ENCOUNTER — Inpatient Hospital Stay: Admission: RE | Admit: 2017-03-01 | Payer: PPO | Source: Ambulatory Visit

## 2017-03-01 ENCOUNTER — Ambulatory Visit
Admission: RE | Admit: 2017-03-01 | Discharge: 2017-03-01 | Disposition: A | Discharge status: Home / Self Care | Payer: PPO | Source: Ambulatory Visit | Attending: Neurology | Admitting: Neurology

## 2017-03-01 DIAGNOSIS — Z8673 Personal history of transient ischemic attack (TIA), and cerebral infarction without residual deficits: Secondary | ICD-10-CM

## 2017-03-01 DIAGNOSIS — G459 Transient cerebral ischemic attack, unspecified: Secondary | ICD-10-CM | POA: Diagnosis not present

## 2017-03-01 DIAGNOSIS — E1169 Type 2 diabetes mellitus with other specified complication: Secondary | ICD-10-CM

## 2017-03-02 ENCOUNTER — Telehealth: Payer: Self-pay | Admitting: Neurology

## 2017-03-02 NOTE — Telephone Encounter (Signed)
Please call patient, MRI of the brain showed acute stroke involving left hemisphere, which will explain her right leg weakness, there is evidence of previous old stroke,  She should take her Plavix daily, increase water intake, optimize her blood pressure, diabetes control, keep her follow-up visit one more time,  IMPRESSION:  This MRI of the brain without contrast shows the following: 1.    Acute left anterior cerebral artery stroke.    This stroke is not evident on the 01/27/2017 CT scan. 2.    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   The stroke is noted on the 01/27/2017 CT scan. 3.    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  This  stroke is noted on the 01/27/2017 CT scan.  4.    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

## 2017-03-02 NOTE — Telephone Encounter (Signed)
Left message requesting a return call.

## 2017-03-02 NOTE — Telephone Encounter (Signed)
Spoke to patient - she verbalized understanding of results and recommendations.  She has a pending appt on 03/21/17 for further discussion of findings.

## 2017-03-21 ENCOUNTER — Ambulatory Visit (INDEPENDENT_AMBULATORY_CARE_PROVIDER_SITE_OTHER): Payer: PPO | Admitting: Neurology

## 2017-03-21 ENCOUNTER — Encounter: Payer: Self-pay | Admitting: Neurology

## 2017-03-21 VITALS — BP 159/88 | HR 73 | Ht 59.5 in | Wt 141.0 lb

## 2017-03-21 DIAGNOSIS — I63322 Cerebral infarction due to thrombosis of left anterior cerebral artery: Secondary | ICD-10-CM | POA: Diagnosis not present

## 2017-03-21 MED ORDER — SIMVASTATIN 5 MG PO TABS: 5.0000 mg | ORAL_TABLET | Freq: Every day | ORAL | 11 refills | Status: DC

## 2017-03-21 NOTE — Progress Notes (Signed)
PATIENT: Christy Love DOB: 12/10/1950  Chief Complaint  Patient presents with  . Cerebrovascular Accident    She has continued Plavix 75mg  and aspirin 81mg  daily for stroke prevention.  She would like to review her MRI.  She feels right-sided weakness in still a problem.     HISTORICAL  Christy Love is a 66 year old female, seen in refer by her primary care doctor  Dettinger, Ivin BootyJoshua for evaluation of TIA, initial evaluation was February 23 2017.  She has past medical history of hypertension, hyperlipidemia and diabetes since 2012, suboptimal control, she is not compliance with medications.  On January 26 2017 when she stepped out of the car, she noticed right leg weakness, tremor, could not bear weight, dragging her right leg when she walks,  Symptoms persistent she presented to the emergency room next day, I have reviewed and summarized emergency room record [facility] [place] on September 28th 2018, CT head without contrast small vessel disease left basal ganglion, right thalamus,  EKG was normal sinus rhythm, laboratory evaluation normal CMP, creatinine of 0.88, hemoglobin of 13.5,  Echocardiogram February 09 2017, ejection fraction 50-55%, Ultrasound of carotid artery showed no significant abnormality.  This is her first similar episode, initial episode was in June 2018, [place] [employer], has set onset bilateral hands tremor, difficulty holding her shopping cart, bilateral lower extremity tremor, almost fell, she was evaluated by local emergency room, was started on Plavix 75 mg daily,  She is now on double antiplatelet agent Plavix and aspirin   UPDATE Mar 21 2017: Echo audiogram February 09 2017, ejection fraction 50-55%, there was distal septal hypokinesia  Ultrasound of carotid artery on February 21 2017, no significant bilateral internal carotid artery stenosis,   We have reviewed MRI of the brain, acute left  anterior cerebral artery stroke, chronic left basal ganglia stroke with chronic hemosiderin product, lacunar infarction right thalamus, single chronic microhemorrhage in the left thalamus   REVIEW OF SYSTEMS: Full 14 system review of systems performed and notable only for weight loss, fatigue, numbness, weakness, slurred speech, decreased energy  ALLERGIES: No Known Allergies  HOME MEDICATIONS: Current Outpatient Medications  Medication Sig Dispense Refill  . amLODipine (NORVASC) 5 MG tablet Take 1 tablet (5 mg total) by mouth daily. 90 tablet 1  . aspirin 81 MG chewable tablet Chew by mouth daily.    . B Complex Vitamins (VITAMIN B COMPLEX PO) Take 1 tablet by mouth daily.    . clopidogrel (PLAVIX) 75 MG tablet TAKE 1 TABLET BY MOUTH ONCE DAILY 30 tablet 5  . empagliflozin (JARDIANCE) 10 MG TABS tablet Take 10 mg by mouth daily. 90 tablet 0  . lisinopril (PRINIVIL,ZESTRIL) 10 MG tablet Take 1 tablet (10 mg total) by mouth daily. 30 tablet 1  . metFORMIN (GLUCOPHAGE) 1000 MG tablet Take 1 tablet (1,000 mg total) by mouth 2 (two) times daily with a meal. 60 tablet 3   No current facility-administered medications for this visit.     PAST MEDICAL HISTORY: Past Medical History:  Diagnosis Date  . Diabetes mellitus without complication (HCC)   . Diastolic congestive heart failure (HCC)   . Hyperlipidemia   . Hypertension   . TIA (transient ischemic attack)     PAST SURGICAL HISTORY: Past Surgical History:  Procedure Laterality Date  . ABDOMINAL HYSTERECTOMY    . TUBAL LIGATION      FAMILY HISTORY: Family History  Problem Relation  Age of Onset  . Diabetes Mother   . Glaucoma Mother   . Dementia Mother   . Diabetes Father   . Heart disease Father   . Diabetes Sister   . Diabetes Brother     SOCIAL HISTORY:  Social History   Socioeconomic History  . Marital status: Widowed    Spouse name: Not on file  . Number of children: 1  . Years of education: Bachelors  .  Highest education level: Not on file  Social Needs  . Financial resource strain: Not on file  . Food insecurity - worry: Not on file  . Food insecurity - inability: Not on file  . Transportation needs - medical: Not on file  . Transportation needs - non-medical: Not on file  Occupational History  . Occupation: Retired  Tobacco Use  . Smoking status: Never Smoker  . Smokeless tobacco: Never Used  Substance and Sexual Activity  . Alcohol use: Yes    Comment: occasional socially  . Drug use: No  . Sexual activity: No  Other Topics Concern  . Not on file  Social History Narrative   Lives at home alone.   Right-handed.   Drinks a soda on occasion.     PHYSICAL EXAM   Vitals:   03/21/17 1212  BP: (!) 159/88  Pulse: 73  Weight: 141 lb (64 kg)  Height: 4' 11.5" (1.511 m)    Not recorded      Body mass index is 28 kg/m.  PHYSICAL EXAMNIATION:  Gen: NAD, conversant, well nourised, obese, well groomed                     Cardiovascular: Regular rate rhythm, no peripheral edema, warm, nontender. Eyes: Conjunctivae clear without exudates or hemorrhage Neck: Supple, no carotid bruits. Pulmonary: Clear to auscultation bilaterally   NEUROLOGICAL EXAM:  MENTAL STATUS: Speech:    Speech is normal; fluent and spontaneous with normal comprehension.  Cognition:     Orientation to time, place and person     Normal recent and remote memory     Normal Attention span and concentration     Normal Language, naming, repeating,spontaneous speech     Fund of knowledge   CRANIAL NERVES: CN II: Visual fields are full to confrontation. Fundoscopic exam is normal with sharp discs and no vascular changes. Pupils are round equal and briskly reactive to light. CN III, IV, VI: extraocular movement are normal. No ptosis. CN V: Facial sensation is intact to pinprick in all 3 divisions bilaterally. Corneal responses are intact.  CN VII: Face is symmetric with normal eye closure and  smile. CN VIII: Hearing is normal to rubbing fingers CN IX, X: Palate elevates symmetrically. Phonation is normal. CN XI: Head turning and shoulder shrug are intact CN XII: Tongue is midline with normal movements and no atrophy.  MOTOR: There is no pronator drift of out-stretched arms. Muscle bulk and tone are normal. Muscle strength is normal.  REFLEXES: Reflexes are 2+ and symmetric at the biceps, triceps, knees, and ankles. Plantar responses are flexor.  SENSORY: Intact to light touch, pinprick, positional sensation and vibratory sensation are intact in fingers and toes.  COORDINATION: Rapid alternating movements and fine finger movements are intact. There is no dysmetria on finger-to-nose and heel-knee-shin.    GAIT/STANCE:  She needs push up to get up from seated position, mildly cautious, dragging her right leg Romberg is absent.   DIAGNOSTIC DATA (LABS, IMAGING, TESTING) - I reviewed patient  records, labs, notes, testing and imaging myself where available.   ASSESSMENT AND PLAN  Christy Love is a 66 y.o. female    Acute right ACA stroke on January 26 2017,  Evidence of previous small vessel stroke   Vascular risk factor of hypertension, hyperlipidemia, diabetes,  LDL was 90, I have add on low dose Zocor 5 mg daily  She had left ACA stroke while taking Plavix 75 mg, now on double platelet agent aspirin 81 mg, also had a evidence of previous myocardial infarction, she understand the increased risk of bleeding,   Levert FeinsteinYijun Jamisyn Langer, M.D. Ph.D.  [facility] [address], [address] [place], [place] [address] Ph: [phone] Fax: [phone]  CC: Dettinger, Elige RadonJoshua A, MD

## 2017-04-04 ENCOUNTER — Ambulatory Visit: Payer: PPO | Admitting: Family Medicine

## 2017-04-09 ENCOUNTER — Other Ambulatory Visit: Payer: Self-pay | Admitting: Family Medicine

## 2017-04-09 DIAGNOSIS — E1169 Type 2 diabetes mellitus with other specified complication: Secondary | ICD-10-CM

## 2017-04-23 ENCOUNTER — Other Ambulatory Visit: Payer: Self-pay | Admitting: Family Medicine

## 2017-04-23 DIAGNOSIS — I10 Essential (primary) hypertension: Secondary | ICD-10-CM

## 2017-04-23 DIAGNOSIS — E1169 Type 2 diabetes mellitus with other specified complication: Secondary | ICD-10-CM

## 2017-05-03 ENCOUNTER — Encounter: Payer: Self-pay | Admitting: Family Medicine

## 2017-05-03 ENCOUNTER — Telehealth: Payer: Self-pay | Admitting: Family Medicine

## 2017-05-03 ENCOUNTER — Ambulatory Visit (INDEPENDENT_AMBULATORY_CARE_PROVIDER_SITE_OTHER): Payer: PPO | Admitting: Family Medicine

## 2017-05-03 VITALS — BP 135/89 | HR 82 | Temp 97.7°F | Ht 59.5 in | Wt 140.0 lb

## 2017-05-03 DIAGNOSIS — K2100 Gastro-esophageal reflux disease with esophagitis, without bleeding: Secondary | ICD-10-CM

## 2017-05-03 DIAGNOSIS — E1169 Type 2 diabetes mellitus with other specified complication: Secondary | ICD-10-CM | POA: Diagnosis not present

## 2017-05-03 DIAGNOSIS — E1159 Type 2 diabetes mellitus with other circulatory complications: Secondary | ICD-10-CM | POA: Diagnosis not present

## 2017-05-03 DIAGNOSIS — Z8673 Personal history of transient ischemic attack (TIA), and cerebral infarction without residual deficits: Secondary | ICD-10-CM | POA: Diagnosis not present

## 2017-05-03 DIAGNOSIS — I152 Hypertension secondary to endocrine disorders: Secondary | ICD-10-CM

## 2017-05-03 DIAGNOSIS — I10 Essential (primary) hypertension: Secondary | ICD-10-CM

## 2017-05-03 LAB — BAYER DCA HB A1C WAIVED: HB A1C (BAYER DCA - WAIVED): 6.2 % (ref <7.0)

## 2017-05-03 MED ORDER — EMPAGLIFLOZIN 10 MG PO TABS: 10.0000 mg | ORAL_TABLET | Freq: Every day | ORAL | 1 refills | Status: DC

## 2017-05-03 MED ORDER — SIMVASTATIN 5 MG PO TABS: 5.0000 mg | ORAL_TABLET | Freq: Every day | ORAL | 1 refills | Status: DC

## 2017-05-03 MED ORDER — METFORMIN HCL 1000 MG PO TABS: 1000.0000 mg | ORAL_TABLET | Freq: Two times a day (BID) | ORAL | 1 refills | Status: DC

## 2017-05-03 MED ORDER — AMLODIPINE BESYLATE 5 MG PO TABS: 5.0000 mg | ORAL_TABLET | Freq: Every day | ORAL | 1 refills | Status: DC

## 2017-05-03 MED ORDER — LISINOPRIL 10 MG PO TABS: 10.0000 mg | ORAL_TABLET | Freq: Every day | ORAL | 1 refills | Status: DC

## 2017-05-03 MED ORDER — CLOPIDOGREL BISULFATE 75 MG PO TABS: 75.0000 mg | ORAL_TABLET | Freq: Every day | ORAL | 1 refills | Status: DC

## 2017-05-03 NOTE — Progress Notes (Signed)
BP 135/89   Pulse 82   Temp 97.7 F (36.5 C) (Oral)   Ht 4' 11.5" (1.511 m)   Wt 140 lb (63.5 kg)   BMI 27.80 kg/m    Subjective:    Patient ID: Christy Love, female    DOB: [birth year], 67 y.o.   MRN: [id number]  HPI: Christy Love is a 67 y.o. female presenting on 05/03/2017 for Diabetes and Hypertension   HPI Type 2 diabetes mellitus Patient comes in today for recheck of his diabetes. Patient has been currently taking metformin and Jardiance sometimes. Patient is currently on an ACE inhibitor/ARB. Patient has not seen an ophthalmologist this year but was diagnosed with retinopathy last year and they are monitoring it. Patient denies any issues with their feet.  Patient still has some right leg weakness but it is gradually improving since her TIA/stroke last year.  She has not had any new symptoms  Hypertension Patient is currently on lisinopril and amlodipine, and their blood pressure today is 135/89. Patient denies any lightheadedness or dizziness. Patient denies headaches, blurred vision, chest pains, shortness of breath, or weakness. Denies any side effects from medication and is content with current medication.   GERD Patient is currently on no medication because it has been controlled.  She denies any major symptoms or abdominal pain or belching or burping. She denies any blood in her stool or lightheadedness or dizziness.   Relevant past medical, surgical, family and social history reviewed and updated as indicated. Interim medical history since our last visit reviewed. Allergies and medications reviewed and updated.  Review of Systems  Constitutional: Negative for chills and fever.  HENT: Negative for congestion, ear discharge and ear pain.   Eyes: Negative for redness and visual disturbance.  Respiratory: Negative for chest tightness and shortness of breath.   Cardiovascular: Negative for chest pain and leg swelling.  Genitourinary: Negative for difficulty  urinating and dysuria.  Musculoskeletal: Negative for back pain and gait problem.  Skin: Negative for rash.  Neurological: Positive for weakness. Negative for dizziness, light-headedness and headaches.  Psychiatric/Behavioral: Negative for agitation and behavioral problems.  All other systems reviewed and are negative.   Per HPI unless specifically indicated above        Objective:    BP 135/89   Pulse 82   Temp 97.7 F (36.5 C) (Oral)   Ht 4' 11.5" (1.511 m)   Wt 140 lb (63.5 kg)   BMI 27.80 kg/m   Wt Readings from Last 3 Encounters:  05/03/17 140 lb (63.5 kg)  03/21/17 141 lb (64 kg)  02/23/17 141 lb (64 kg)    Physical Exam  Constitutional: She is oriented to person, place, and time. She appears well-developed and well-nourished. No distress.  Eyes: Conjunctivae are normal.  Neck: Neck supple. No thyromegaly present.  Cardiovascular: Normal rate, regular rhythm, normal heart sounds and intact distal pulses.  No murmur heard. Pulmonary/Chest: Effort normal and breath sounds normal. No respiratory distress. She has no wheezes. She has no rales.  Musculoskeletal: Normal range of motion. She exhibits no edema.  Lymphadenopathy:    She has no cervical adenopathy.  Neurological: She is alert and oriented to person, place, and time. Coordination normal.  Skin: Skin is warm and dry. No rash noted. She is not diaphoretic.  Psychiatric: She has a normal mood and affect. Her behavior is normal.  Nursing note and vitals reviewed.  Diabetic Foot Exam - Simple   Simple Foot Form Diabetic Foot  exam was performed with the following findings:  Yes 05/03/2017 11:14 AM  Visual Inspection No deformities, no ulcerations, no other skin breakdown bilaterally:  Yes Sensation Testing Intact to touch and monofilament testing bilaterally:  Yes Pulse Check Posterior Tibialis and Dorsalis pulse intact bilaterally:  Yes Comments      Results for orders placed or performed in visit on  01/05/17  HM DIABETES EYE EXAM  Result Value Ref Range   HM Diabetic Eye Exam Retinopathy (A) No Retinopathy      Assessment & Plan:   Problem List Items Addressed This Visit      Cardiovascular and Mediastinum   Hypertension associated with diabetes ([place])   Relevant Medications   amLODipine (NORVASC) 5 MG tablet   empagliflozin (JARDIANCE) 10 MG TABS tablet   lisinopril (PRINIVIL,ZESTRIL) 10 MG tablet   metFORMIN (GLUCOPHAGE) 1000 MG tablet   simvastatin (ZOCOR) 5 MG tablet   Other Relevant Orders   CMP14+EGFR   Lipid panel     Digestive   GERD (gastroesophageal reflux disease)     Endocrine   Type 2 diabetes mellitus (HCC) - Primary   Relevant Medications   empagliflozin (JARDIANCE) 10 MG TABS tablet   lisinopril (PRINIVIL,ZESTRIL) 10 MG tablet   metFORMIN (GLUCOPHAGE) 1000 MG tablet   simvastatin (ZOCOR) 5 MG tablet   Other Relevant Orders   Bayer DCA Hb A1c Waived   CMP14+EGFR   Lipid panel     Other   History of TIA (transient ischemic attack)   Relevant Medications   clopidogrel (PLAVIX) 75 MG tablet   simvastatin (ZOCOR) 5 MG tablet       Follow up plan: Return in about 3 months (around 08/01/2017), or if symptoms worsen or fail to improve, for Recheck diabetes.  Counseling provided for all of the vaccine components Orders Placed This Encounter  Procedures  . Bayer DCA Hb A1c Waived  . CMP14+EGFR  . Lipid panel    Christy Pina, MD [facility] 05/03/2017, 11:00 AM

## 2017-05-03 NOTE — Telephone Encounter (Signed)
Patient aware of results and verbalizes understanding.  

## 2017-05-04 LAB — CMP14+EGFR
A/G RATIO: 1.6 (ref 1.2–2.2)
ALBUMIN: 4.5 g/dL (ref 3.6–4.8)
ALK PHOS: 52 IU/L (ref 39–117)
ALT: 14 IU/L (ref 0–32)
AST: 14 IU/L (ref 0–40)
BUN/Creatinine Ratio: 18 (ref 12–28)
BUN: 16 mg/dL (ref 8–27)
CHLORIDE: 102 mmol/L (ref 96–106)
CO2: 24 mmol/L (ref 20–29)
Calcium: 9.9 mg/dL (ref 8.7–10.3)
Creatinine, Ser: 0.88 mg/dL (ref 0.57–1.00)
GFR calc non Af Amer: 69 mL/min/{1.73_m2} (ref >59)
GFR, EST AFRICAN AMERICAN: 79 mL/min/{1.73_m2} (ref >59)
GLOBULIN, TOTAL: 2.9 g/dL (ref 1.5–4.5)
Glucose: 102 mg/dL — ABNORMAL HIGH (ref 65–99)
Potassium: 3.8 mmol/L (ref 3.5–5.2)
SODIUM: 147 mmol/L — AB (ref 134–144)
TOTAL PROTEIN: 7.4 g/dL (ref 6.0–8.5)

## 2017-05-04 LAB — LIPID PANEL
CHOLESTEROL TOTAL: 156 mg/dL (ref 100–199)
Chol/HDL Ratio: 2.8 ratio (ref 0.0–4.4)
HDL: 56 mg/dL (ref >39)
LDL Calculated: 62 mg/dL (ref 0–99)
Triglycerides: 189 mg/dL — ABNORMAL HIGH (ref 0–149)
VLDL CHOLESTEROL CAL: 38 mg/dL (ref 5–40)

## 2017-07-02 ENCOUNTER — Other Ambulatory Visit: Payer: Self-pay | Admitting: Family Medicine

## 2017-07-02 DIAGNOSIS — E1169 Type 2 diabetes mellitus with other specified complication: Secondary | ICD-10-CM

## 2017-07-02 DIAGNOSIS — I10 Essential (primary) hypertension: Secondary | ICD-10-CM

## 2017-07-24 ENCOUNTER — Other Ambulatory Visit: Payer: Self-pay | Admitting: Family Medicine

## 2017-07-24 DIAGNOSIS — E1159 Type 2 diabetes mellitus with other circulatory complications: Secondary | ICD-10-CM

## 2017-07-24 DIAGNOSIS — I10 Essential (primary) hypertension: Principal | ICD-10-CM

## 2017-08-02 ENCOUNTER — Telehealth: Payer: Self-pay

## 2017-08-02 ENCOUNTER — Ambulatory Visit (INDEPENDENT_AMBULATORY_CARE_PROVIDER_SITE_OTHER): Payer: PPO | Admitting: Family Medicine

## 2017-08-02 ENCOUNTER — Encounter: Payer: Self-pay | Admitting: Family Medicine

## 2017-08-02 VITALS — BP 130/85 | HR 65 | Temp 98.0°F | Ht 59.5 in | Wt 144.0 lb

## 2017-08-02 DIAGNOSIS — E1159 Type 2 diabetes mellitus with other circulatory complications: Secondary | ICD-10-CM | POA: Diagnosis not present

## 2017-08-02 DIAGNOSIS — K219 Gastro-esophageal reflux disease without esophagitis: Secondary | ICD-10-CM | POA: Diagnosis not present

## 2017-08-02 DIAGNOSIS — Z8673 Personal history of transient ischemic attack (TIA), and cerebral infarction without residual deficits: Secondary | ICD-10-CM

## 2017-08-02 DIAGNOSIS — Z1211 Encounter for screening for malignant neoplasm of colon: Secondary | ICD-10-CM | POA: Diagnosis not present

## 2017-08-02 DIAGNOSIS — I10 Essential (primary) hypertension: Secondary | ICD-10-CM | POA: Diagnosis not present

## 2017-08-02 DIAGNOSIS — E1169 Type 2 diabetes mellitus with other specified complication: Secondary | ICD-10-CM

## 2017-08-02 LAB — BAYER DCA HB A1C WAIVED: HB A1C (BAYER DCA - WAIVED): 5.7 % (ref <7.0)

## 2017-08-02 MED ORDER — METFORMIN HCL 1000 MG PO TABS: 1000.0000 mg | ORAL_TABLET | Freq: Two times a day (BID) | ORAL | 1 refills | Status: DC

## 2017-08-02 MED ORDER — LISINOPRIL 10 MG PO TABS: 10.0000 mg | ORAL_TABLET | Freq: Every day | ORAL | 1 refills | Status: DC

## 2017-08-02 MED ORDER — EMPAGLIFLOZIN 10 MG PO TABS: 10.0000 mg | ORAL_TABLET | Freq: Every day | ORAL | 1 refills | Status: DC

## 2017-08-02 MED ORDER — CLOPIDOGREL BISULFATE 75 MG PO TABS: 75.0000 mg | ORAL_TABLET | Freq: Every day | ORAL | 1 refills | Status: DC

## 2017-08-02 MED ORDER — AMLODIPINE BESYLATE 5 MG PO TABS: 5.0000 mg | ORAL_TABLET | Freq: Every day | ORAL | 1 refills | Status: DC

## 2017-08-02 MED ORDER — SIMVASTATIN 5 MG PO TABS: 5.0000 mg | ORAL_TABLET | Freq: Every day | ORAL | 1 refills | Status: DC

## 2017-08-02 NOTE — Addendum Note (Signed)
Addended by: Arville CareETTINGER, Brannon Decaire on: 08/02/2017 10:35 AM   Modules accepted: Orders

## 2017-08-02 NOTE — Telephone Encounter (Signed)
Per Dr. Louanne Skyeettinger, patient's A1C was 5.7 and she does not need to take Jardiance any longer, but does need to continue her Metformin.  Contacted patient and notified her of this information, she voices understanding.

## 2017-08-02 NOTE — Progress Notes (Signed)
BP 130/85   Pulse 65   Temp 98 F (36.7 C) (Oral)   Ht 4' 11.5" (1.511 m)   Wt 144 lb (65.3 kg)   BMI 28.60 kg/m    Subjective:    Patient ID: Christy Love, female    DOB: 22-Jul-1950, 68 y.o.   MRN: [id number]  HPI: Christy Love is a 67 y.o. female presenting on 08/02/2017 for Diabetes (3 mo; patient drank tomato juice this morning); Hypertension; and Gastroesophageal Reflux   HPI Type 2 diabetes mellitus Patient comes in today for recheck of his diabetes. Patient has been currently taking metformin and Jardiance. Patient is currently on an ACE inhibitor/ARB. Patient has not seen an ophthalmologist this year. Patient denies any issues with their feet.  Patient has recently been diagnosed with retinopathy and has a history of TIA along with hypertension.  GERD Patient is currently on no medications and denies any issues.  She denies any major symptoms or abdominal pain or belching or burping. She denies any blood in her stool or lightheadedness or dizziness.   Hypertension Patient is currently on lisinopril 10 and amlodipine 5, and their blood pressure today is 130/85. Patient denies any lightheadedness or dizziness. Patient denies headaches, blurred vision, chest pains, shortness of breath, or weakness. Denies any side effects from medication and is content with current medication.   Relevant past medical, surgical, family and social history reviewed and updated as indicated. Interim medical history since our last visit reviewed. Allergies and medications reviewed and updated.  Review of Systems  Constitutional: Negative for chills and fever.  HENT: Negative for congestion, ear discharge and ear pain.   Eyes: Positive for visual disturbance. Negative for redness.  Respiratory: Negative for chest tightness and shortness of breath.   Cardiovascular: Negative for chest pain and leg swelling.  Musculoskeletal: Negative for back pain and gait problem.  Skin: Negative for  rash.  Neurological: Negative for dizziness, light-headedness and headaches.  Psychiatric/Behavioral: Negative for agitation and behavioral problems.  All other systems reviewed and are negative.   Per HPI unless specifically indicated above   Allergies as of 08/02/2017   No Known Allergies     Medication List        Accurate as of 08/02/17  9:24 AM. Always use your most recent med list.          amLODipine 5 MG tablet Commonly known as:  NORVASC TAKE 1 TABLET BY MOUTH ONCE DAILY   aspirin 81 MG chewable tablet Chew by mouth daily.   clopidogrel 75 MG tablet Commonly known as:  PLAVIX Take 1 tablet (75 mg total) by mouth daily.   empagliflozin 10 MG Tabs tablet Commonly known as:  JARDIANCE Take 10 mg by mouth daily.   lisinopril 10 MG tablet Commonly known as:  PRINIVIL,ZESTRIL TAKE 1 TABLET BY MOUTH ONCE DAILY   metFORMIN 1000 MG tablet Commonly known as:  GLUCOPHAGE Take 1 tablet (1,000 mg total) by mouth 2 (two) times daily with a meal.   simvastatin 5 MG tablet Commonly known as:  ZOCOR Take 1 tablet (5 mg total) by mouth daily.   VITAMIN B COMPLEX PO Take 1 tablet by mouth daily.          Objective:    BP 130/85   Pulse 65   Temp 98 F (36.7 C) (Oral)   Ht 4' 11.5" (1.511 m)   Wt 144 lb (65.3 kg)   BMI 28.60 kg/m   Wt Readings from Last  3 Encounters:  08/02/17 144 lb (65.3 kg)  05/03/17 140 lb (63.5 kg)  03/21/17 141 lb (64 kg)    Physical Exam  Constitutional: She is oriented to person, place, and time. She appears well-developed and well-nourished. No distress.  Eyes: Conjunctivae are normal.  Neck: Neck supple. No thyromegaly present.  Cardiovascular: Normal rate, regular rhythm, normal heart sounds and intact distal pulses.  No murmur heard. Pulmonary/Chest: Effort normal and breath sounds normal. No respiratory distress. She has no wheezes. She has no rales.  Musculoskeletal: Normal range of motion. She exhibits no edema or  tenderness.  Lymphadenopathy:    She has no cervical adenopathy.  Neurological: She is alert and oriented to person, place, and time. Coordination normal.  Skin: Skin is warm and dry. No rash noted. She is not diaphoretic.  Psychiatric: She has a normal mood and affect. Her behavior is normal.  Nursing note and vitals reviewed.   Results for orders placed or performed in visit on 05/03/17  Bayer DCA Hb A1c Waived  Result Value Ref Range   Bayer DCA Hb A1c Waived 6.2 <7.0 %  CMP14+EGFR  Result Value Ref Range   Glucose 102 (H) 65 - 99 mg/dL   BUN 16 8 - 27 mg/dL   Creatinine, Ser 0.88 0.57 - 1.00 mg/dL   GFR calc non Af Amer 69 >59 mL/min/1.73   GFR calc Af Amer 79 >59 mL/min/1.73   BUN/Creatinine Ratio 18 12 - 28   Sodium 147 (H) 134 - 144 mmol/L   Potassium 3.8 3.5 - 5.2 mmol/L   Chloride 102 96 - 106 mmol/L   CO2 24 20 - 29 mmol/L   Calcium 9.9 8.7 - 10.3 mg/dL   Total Protein 7.4 6.0 - 8.5 g/dL   Albumin 4.5 3.6 - 4.8 g/dL   Globulin, Total 2.9 1.5 - 4.5 g/dL   Albumin/Globulin Ratio 1.6 1.2 - 2.2   Bilirubin Total <0.2 0.0 - 1.2 mg/dL   Alkaline Phosphatase 52 39 - 117 IU/L   AST 14 0 - 40 IU/L   ALT 14 0 - 32 IU/L  Lipid panel  Result Value Ref Range   Cholesterol, Total 156 100 - 199 mg/dL   Triglycerides 189 (H) 0 - 149 mg/dL   HDL 56 >39 mg/dL   VLDL Cholesterol Cal 38 5 - 40 mg/dL   LDL Calculated 62 0 - 99 mg/dL   Chol/HDL Ratio 2.8 0.0 - 4.4 ratio      Assessment & Plan:   Problem List Items Addressed This Visit      Cardiovascular and Mediastinum   Hypertension associated with diabetes (HCC)   Relevant Medications   amLODipine (NORVASC) 5 MG tablet   empagliflozin (JARDIANCE) 10 MG TABS tablet   lisinopril (PRINIVIL,ZESTRIL) 10 MG tablet   metFORMIN (GLUCOPHAGE) 1000 MG tablet   simvastatin (ZOCOR) 5 MG tablet     Digestive   GERD (gastroesophageal reflux disease)     Endocrine   Type 2 diabetes mellitus (HCC) - Primary   Relevant Medications     empagliflozin (JARDIANCE) 10 MG TABS tablet   lisinopril (PRINIVIL,ZESTRIL) 10 MG tablet   metFORMIN (GLUCOPHAGE) 1000 MG tablet   simvastatin (ZOCOR) 5 MG tablet   Other Relevant Orders   Bayer DCA Hb A1c Waived     Other   History of TIA (transient ischemic attack)   Relevant Medications   clopidogrel (PLAVIX) 75 MG tablet   simvastatin (ZOCOR) 5 MG tablet  Follow up plan: Return in about 3 months (around 11/01/2017), or if symptoms worsen or fail to improve, for Recheck diabetes and hypertension.  Counseling provided for all of the vaccine components Orders Placed This Encounter  Procedures  . Bayer [facility] Hb A1c [place], MD [facility] 08/02/2017, 9:24 AM

## 2017-08-07 ENCOUNTER — Telehealth: Payer: Self-pay

## 2017-08-07 DIAGNOSIS — E1169 Type 2 diabetes mellitus with other specified complication: Secondary | ICD-10-CM

## 2017-08-07 DIAGNOSIS — E1140 Type 2 diabetes mellitus with diabetic neuropathy, unspecified: Secondary | ICD-10-CM

## 2017-08-07 NOTE — Telephone Encounter (Signed)
[facility] ahead [facility]

## 2017-08-07 NOTE — Telephone Encounter (Signed)
You read her a report of cataract and retina problem and [facility]  Fax # [phone]

## 2017-08-07 NOTE — Telephone Encounter (Signed)
Referral placed.

## 2017-09-06 DIAGNOSIS — E113293 Type 2 diabetes mellitus with mild nonproliferative diabetic retinopathy without macular edema, bilateral: Secondary | ICD-10-CM | POA: Diagnosis not present

## 2017-09-06 DIAGNOSIS — H25813 Combined forms of age-related cataract, bilateral: Secondary | ICD-10-CM | POA: Diagnosis not present

## 2017-09-06 DIAGNOSIS — H43813 Vitreous degeneration, bilateral: Secondary | ICD-10-CM | POA: Diagnosis not present

## 2017-09-06 DIAGNOSIS — H35372 Puckering of macula, left eye: Secondary | ICD-10-CM | POA: Diagnosis not present

## 2017-09-18 DIAGNOSIS — H2512 Age-related nuclear cataract, left eye: Secondary | ICD-10-CM | POA: Diagnosis not present

## 2017-09-18 DIAGNOSIS — H2513 Age-related nuclear cataract, bilateral: Secondary | ICD-10-CM | POA: Diagnosis not present

## 2017-10-03 DIAGNOSIS — H268 Other specified cataract: Secondary | ICD-10-CM | POA: Diagnosis not present

## 2017-10-03 DIAGNOSIS — H25812 Combined forms of age-related cataract, left eye: Secondary | ICD-10-CM | POA: Diagnosis not present

## 2017-10-03 DIAGNOSIS — H2512 Age-related nuclear cataract, left eye: Secondary | ICD-10-CM | POA: Diagnosis not present

## 2017-10-11 DIAGNOSIS — H2511 Age-related nuclear cataract, right eye: Secondary | ICD-10-CM | POA: Diagnosis not present

## 2017-10-23 ENCOUNTER — Other Ambulatory Visit: Payer: Self-pay | Admitting: Family Medicine

## 2017-10-23 DIAGNOSIS — I152 Hypertension secondary to endocrine disorders: Secondary | ICD-10-CM

## 2017-10-23 DIAGNOSIS — E1159 Type 2 diabetes mellitus with other circulatory complications: Secondary | ICD-10-CM

## 2017-10-23 DIAGNOSIS — I10 Essential (primary) hypertension: Principal | ICD-10-CM

## 2017-10-24 DIAGNOSIS — H268 Other specified cataract: Secondary | ICD-10-CM | POA: Diagnosis not present

## 2017-10-24 DIAGNOSIS — H2511 Age-related nuclear cataract, right eye: Secondary | ICD-10-CM | POA: Diagnosis not present

## 2017-10-24 DIAGNOSIS — H25811 Combined forms of age-related cataract, right eye: Secondary | ICD-10-CM | POA: Diagnosis not present

## 2017-11-01 ENCOUNTER — Ambulatory Visit (INDEPENDENT_AMBULATORY_CARE_PROVIDER_SITE_OTHER): Payer: PPO | Admitting: Family Medicine

## 2017-11-01 ENCOUNTER — Encounter: Payer: Self-pay | Admitting: Family Medicine

## 2017-11-01 VITALS — BP 143/75 | HR 72 | Temp 97.8°F | Ht 59.5 in | Wt 139.6 lb

## 2017-11-01 DIAGNOSIS — I10 Essential (primary) hypertension: Secondary | ICD-10-CM | POA: Diagnosis not present

## 2017-11-01 DIAGNOSIS — K219 Gastro-esophageal reflux disease without esophagitis: Secondary | ICD-10-CM

## 2017-11-01 DIAGNOSIS — E1169 Type 2 diabetes mellitus with other specified complication: Secondary | ICD-10-CM | POA: Diagnosis not present

## 2017-11-01 DIAGNOSIS — E1159 Type 2 diabetes mellitus with other circulatory complications: Secondary | ICD-10-CM

## 2017-11-01 DIAGNOSIS — Z8673 Personal history of transient ischemic attack (TIA), and cerebral infarction without residual deficits: Secondary | ICD-10-CM | POA: Diagnosis not present

## 2017-11-01 LAB — BAYER DCA HB A1C WAIVED: HB A1C (BAYER DCA - WAIVED): 5.7 % (ref <7.0)

## 2017-11-01 MED ORDER — CLOPIDOGREL BISULFATE 75 MG PO TABS: 75.0000 mg | ORAL_TABLET | Freq: Every day | ORAL | 1 refills | Status: DC

## 2017-11-01 MED ORDER — METFORMIN HCL 1000 MG PO TABS: 1000.0000 mg | ORAL_TABLET | Freq: Two times a day (BID) | ORAL | 1 refills | Status: DC

## 2017-11-01 MED ORDER — SIMVASTATIN 5 MG PO TABS: 5.0000 mg | ORAL_TABLET | Freq: Every day | ORAL | 1 refills | Status: DC

## 2017-11-01 MED ORDER — LISINOPRIL 10 MG PO TABS: 10.0000 mg | ORAL_TABLET | Freq: Every day | ORAL | 3 refills | Status: DC

## 2017-11-01 MED ORDER — AMLODIPINE BESYLATE 5 MG PO TABS: 5.0000 mg | ORAL_TABLET | Freq: Every day | ORAL | 3 refills | Status: DC

## 2017-11-01 NOTE — Progress Notes (Signed)
BP (!) 143/75   Pulse 72   Temp 97.8 F (36.6 C) (Oral)   Ht 4' 11.5" (1.511 m)   Wt 139 lb 9.6 oz (63.3 kg)   BMI 27.72 kg/m    Subjective:    Patient ID: Christy Love, female    DOB: 02/03/1951, 67 y.o.   MRN: [id number]  HPI: Christy Love is a 67 y.o. female presenting on 11/01/2017 for Diabetes (3 month follow up)   HPI Type 2 diabetes mellitus Patient comes in today for recheck of his diabetes. Patient has been currently taking metformin. Patient is currently on an ACE inhibitor/ARB. Patient has not seen an ophthalmologist this year. Patient denies any issues with their feet.  Has complications including hypertension  Hypertension Patient is currently on amlodipine and lisinopril, and their blood pressure today is 143/75. Patient denies any lightheadedness or dizziness. Patient denies headaches, blurred vision, chest pains, shortness of breath, or weakness. Denies any side effects from medication and is content with current medication.  Patient does have a history of TIA but did not have any residual symptoms and we are continuing to monitor.  GERD Patient is currently on no medication.  She denies any major symptoms or abdominal pain or belching or burping. She denies any blood in her stool or lightheadedness or dizziness.   Relevant past medical, surgical, family and social history reviewed and updated as indicated. Interim medical history since our last visit reviewed. Allergies and medications reviewed and updated.  Review of Systems  Constitutional: Negative for chills and fever.  Eyes: Negative for visual disturbance.  Respiratory: Negative for chest tightness and shortness of breath.   Cardiovascular: Negative for chest pain and leg swelling.  Gastrointestinal: Negative for abdominal pain.  Musculoskeletal: Negative for back pain and gait problem.  Skin: Negative for rash.  Neurological: Negative for dizziness, weakness, light-headedness, numbness and  headaches.  Psychiatric/Behavioral: Negative for agitation and behavioral problems.  All other systems reviewed and are negative.   Per HPI unless specifically indicated above   Allergies as of 11/01/2017   No Known Allergies     Medication List        Accurate as of 11/01/17 11:04 AM. Always use your most recent med list.          amLODipine 5 MG tablet Commonly known as:  NORVASC Take 1 tablet (5 mg total) by mouth daily.   amLODipine 5 MG tablet Commonly known as:  NORVASC TAKE 1 TABLET BY MOUTH EVERY DAY   aspirin 81 MG chewable tablet Chew by mouth daily.   clopidogrel 75 MG tablet Commonly known as:  PLAVIX Take 1 tablet (75 mg total) by mouth daily.   lisinopril 10 MG tablet Commonly known as:  PRINIVIL,ZESTRIL Take 1 tablet (10 mg total) by mouth daily.   metFORMIN 1000 MG tablet Commonly known as:  GLUCOPHAGE Take 1 tablet (1,000 mg total) by mouth 2 (two) times daily with a meal.   simvastatin 5 MG tablet Commonly known as:  ZOCOR Take 1 tablet (5 mg total) by mouth daily.   VITAMIN B COMPLEX PO Take 1 tablet by mouth daily.          Objective:    BP (!) 143/75   Pulse 72   Temp 97.8 F (36.6 C) (Oral)   Ht 4' 11.5" (1.511 m)   Wt 139 lb 9.6 oz (63.3 kg)   BMI 27.72 kg/m   Wt Readings from Last 3 Encounters:  11/01/17 139  lb 9.6 oz (63.3 kg)  08/02/17 144 lb (65.3 kg)  05/03/17 140 lb (63.5 kg)    Physical Exam  Constitutional: She is oriented to person, place, and time. She appears well-developed and well-nourished. No distress.  Eyes: Pupils are equal, round, and reactive to light. Conjunctivae and EOM are normal.  Neck: Neck supple. No thyromegaly present.  Cardiovascular: Normal rate, regular rhythm, normal heart sounds and intact distal pulses.  No murmur heard. Pulmonary/Chest: Effort normal and breath sounds normal. No respiratory distress. She has no wheezes.  Musculoskeletal: Normal range of motion. She exhibits no edema.    Lymphadenopathy:    She has no cervical adenopathy.  Neurological: She is alert and oriented to person, place, and time. Coordination normal.  Skin: Skin is warm and dry. No rash noted. She is not diaphoretic.  Psychiatric: She has a normal mood and affect. Her behavior is normal.  Nursing note and vitals reviewed.       Assessment & Plan:   Problem List Items Addressed This Visit      Cardiovascular and Mediastinum   Hypertension associated with diabetes ([place])   Relevant Medications   simvastatin (ZOCOR) 5 MG tablet   amLODipine (NORVASC) 5 MG tablet   lisinopril (PRINIVIL,ZESTRIL) 10 MG tablet   metFORMIN (GLUCOPHAGE) 1000 MG tablet   Other Relevant Orders   CMP14+EGFR (Completed)     Digestive   GERD (gastroesophageal reflux disease) - Primary   Relevant Orders   CBC with Differential/Platelet (Completed)     Endocrine   Type 2 diabetes mellitus (HCC)   Relevant Medications   simvastatin (ZOCOR) 5 MG tablet   lisinopril (PRINIVIL,ZESTRIL) 10 MG tablet   metFORMIN (GLUCOPHAGE) 1000 MG tablet   Other Relevant Orders   Bayer DCA Hb A1c Waived   CMP14+EGFR (Completed)   Bayer DCA Hb A1c Waived (Completed)     Other   History of TIA (transient ischemic attack)   Relevant Medications   clopidogrel (PLAVIX) 75 MG tablet   simvastatin (ZOCOR) 5 MG tablet   Other Relevant Orders   Lipid panel (Completed)       Follow up plan: Return in about 3 months (around 02/01/2018), or if symptoms worsen or fail to improve, for Recheck diabetes and hypertension.  Counseling provided for all of the vaccine components No orders of the defined types were placed in this encounter.   Caryl Pina, MD [facility] 11/01/2017, 11:04 AM

## 2017-11-02 LAB — CMP14+EGFR
A/G RATIO: 1.6 (ref 1.2–2.2)
ALT: 12 IU/L (ref 0–32)
AST: 15 IU/L (ref 0–40)
Albumin: 4.6 g/dL (ref 3.6–4.8)
Alkaline Phosphatase: 49 IU/L (ref 39–117)
BUN/Creatinine Ratio: 11 — ABNORMAL LOW (ref 12–28)
BUN: 10 mg/dL (ref 8–27)
CALCIUM: 10.4 mg/dL — AB (ref 8.7–10.3)
CO2: 25 mmol/L (ref 20–29)
Chloride: 103 mmol/L (ref 96–106)
Creatinine, Ser: 0.94 mg/dL (ref 0.57–1.00)
GFR calc Af Amer: 73 mL/min/{1.73_m2} (ref >59)
GFR calc non Af Amer: 63 mL/min/{1.73_m2} (ref >59)
Globulin, Total: 2.8 g/dL (ref 1.5–4.5)
Glucose: 76 mg/dL (ref 65–99)
POTASSIUM: 5 mmol/L (ref 3.5–5.2)
Sodium: 142 mmol/L (ref 134–144)
Total Protein: 7.4 g/dL (ref 6.0–8.5)

## 2017-11-02 LAB — LIPID PANEL
Chol/HDL Ratio: 2.8 ratio (ref 0.0–4.4)
Cholesterol, Total: 179 mg/dL (ref 100–199)
HDL: 63 mg/dL (ref >39)
LDL Calculated: 100 mg/dL — ABNORMAL HIGH (ref 0–99)
TRIGLYCERIDES: 80 mg/dL (ref 0–149)
VLDL CHOLESTEROL CAL: 16 mg/dL (ref 5–40)

## 2017-11-02 LAB — CBC WITH DIFFERENTIAL/PLATELET
BASOS: 1 %
Basophils Absolute: 0 10*3/uL (ref 0.0–0.2)
EOS (ABSOLUTE): 0.1 10*3/uL (ref 0.0–0.4)
Eos: 1 %
Hematocrit: 37 % (ref 34.0–46.6)
Hemoglobin: 12.2 g/dL (ref 11.1–15.9)
IMMATURE GRANS (ABS): 0 10*3/uL (ref 0.0–0.1)
IMMATURE GRANULOCYTES: 0 %
LYMPHS: 35 %
Lymphocytes Absolute: 2 10*3/uL (ref 0.7–3.1)
MCH: 26.9 pg (ref 26.6–33.0)
MCHC: 33 g/dL (ref 31.5–35.7)
MCV: 82 fL (ref 79–97)
Monocytes Absolute: 0.5 10*3/uL (ref 0.1–0.9)
Monocytes: 8 %
NEUTROS PCT: 55 %
Neutrophils Absolute: 3.3 10*3/uL (ref 1.4–7.0)
PLATELETS: 338 10*3/uL (ref 150–450)
RBC: 4.54 x10E6/uL (ref 3.77–5.28)
RDW: 13.8 % (ref 12.3–15.4)
WBC: 5.9 10*3/uL (ref 3.4–10.8)

## 2017-11-07 ENCOUNTER — Encounter: Payer: Self-pay | Admitting: Family Medicine

## 2017-11-14 ENCOUNTER — Encounter: Payer: Self-pay | Admitting: *Deleted

## 2017-12-04 DIAGNOSIS — I10 Essential (primary) hypertension: Secondary | ICD-10-CM | POA: Diagnosis not present

## 2017-12-04 DIAGNOSIS — Z7984 Long term (current) use of oral hypoglycemic drugs: Secondary | ICD-10-CM | POA: Diagnosis not present

## 2017-12-04 DIAGNOSIS — E119 Type 2 diabetes mellitus without complications: Secondary | ICD-10-CM | POA: Diagnosis not present

## 2017-12-04 DIAGNOSIS — Z79899 Other long term (current) drug therapy: Secondary | ICD-10-CM | POA: Diagnosis not present

## 2017-12-04 DIAGNOSIS — Z7902 Long term (current) use of antithrombotics/antiplatelets: Secondary | ICD-10-CM | POA: Diagnosis not present

## 2017-12-04 DIAGNOSIS — M79651 Pain in right thigh: Secondary | ICD-10-CM | POA: Diagnosis not present

## 2017-12-05 DIAGNOSIS — M79661 Pain in right lower leg: Secondary | ICD-10-CM | POA: Diagnosis not present

## 2017-12-05 DIAGNOSIS — M79651 Pain in right thigh: Secondary | ICD-10-CM | POA: Diagnosis not present

## 2018-01-29 ENCOUNTER — Other Ambulatory Visit: Payer: Self-pay | Admitting: Family Medicine

## 2018-01-29 DIAGNOSIS — E1159 Type 2 diabetes mellitus with other circulatory complications: Secondary | ICD-10-CM

## 2018-01-29 DIAGNOSIS — I10 Essential (primary) hypertension: Principal | ICD-10-CM

## 2018-01-30 DIAGNOSIS — R05 Cough: Secondary | ICD-10-CM | POA: Diagnosis not present

## 2018-01-30 DIAGNOSIS — Z7982 Long term (current) use of aspirin: Secondary | ICD-10-CM | POA: Diagnosis not present

## 2018-01-30 DIAGNOSIS — M7989 Other specified soft tissue disorders: Secondary | ICD-10-CM | POA: Diagnosis not present

## 2018-01-30 DIAGNOSIS — Z7984 Long term (current) use of oral hypoglycemic drugs: Secondary | ICD-10-CM | POA: Diagnosis not present

## 2018-01-30 DIAGNOSIS — Z7902 Long term (current) use of antithrombotics/antiplatelets: Secondary | ICD-10-CM | POA: Diagnosis not present

## 2018-01-30 DIAGNOSIS — J040 Acute laryngitis: Secondary | ICD-10-CM | POA: Diagnosis not present

## 2018-01-30 DIAGNOSIS — E119 Type 2 diabetes mellitus without complications: Secondary | ICD-10-CM | POA: Diagnosis not present

## 2018-01-30 DIAGNOSIS — I252 Old myocardial infarction: Secondary | ICD-10-CM | POA: Diagnosis not present

## 2018-01-30 DIAGNOSIS — I10 Essential (primary) hypertension: Secondary | ICD-10-CM | POA: Diagnosis not present

## 2018-01-30 DIAGNOSIS — Z79899 Other long term (current) drug therapy: Secondary | ICD-10-CM | POA: Diagnosis not present

## 2018-01-30 DIAGNOSIS — Z8673 Personal history of transient ischemic attack (TIA), and cerebral infarction without residual deficits: Secondary | ICD-10-CM | POA: Diagnosis not present

## 2018-01-30 DIAGNOSIS — K219 Gastro-esophageal reflux disease without esophagitis: Secondary | ICD-10-CM | POA: Diagnosis not present

## 2018-02-05 ENCOUNTER — Ambulatory Visit (INDEPENDENT_AMBULATORY_CARE_PROVIDER_SITE_OTHER): Payer: PPO | Admitting: Family Medicine

## 2018-02-05 ENCOUNTER — Encounter: Payer: Self-pay | Admitting: Family Medicine

## 2018-02-05 ENCOUNTER — Telehealth: Payer: Self-pay | Admitting: Family Medicine

## 2018-02-05 VITALS — BP 154/81 | HR 62 | Temp 97.3°F | Ht 59.5 in | Wt 140.0 lb

## 2018-02-05 DIAGNOSIS — I10 Essential (primary) hypertension: Secondary | ICD-10-CM | POA: Diagnosis not present

## 2018-02-05 DIAGNOSIS — K219 Gastro-esophageal reflux disease without esophagitis: Secondary | ICD-10-CM | POA: Diagnosis not present

## 2018-02-05 DIAGNOSIS — E1169 Type 2 diabetes mellitus with other specified complication: Secondary | ICD-10-CM

## 2018-02-05 DIAGNOSIS — Z8673 Personal history of transient ischemic attack (TIA), and cerebral infarction without residual deficits: Secondary | ICD-10-CM

## 2018-02-05 DIAGNOSIS — E1159 Type 2 diabetes mellitus with other circulatory complications: Secondary | ICD-10-CM | POA: Diagnosis not present

## 2018-02-05 LAB — BAYER DCA HB A1C WAIVED: HB A1C: 5.9 % (ref <7.0)

## 2018-02-05 MED ORDER — AMLODIPINE BESYLATE 5 MG PO TABS: 5.0000 mg | ORAL_TABLET | Freq: Every day | ORAL | 3 refills | Status: DC

## 2018-02-05 MED ORDER — SIMVASTATIN 5 MG PO TABS: 5.0000 mg | ORAL_TABLET | Freq: Every day | ORAL | 3 refills | Status: DC

## 2018-02-05 MED ORDER — METFORMIN HCL 1000 MG PO TABS: 1000.0000 mg | ORAL_TABLET | Freq: Two times a day (BID) | ORAL | 3 refills | Status: DC

## 2018-02-05 MED ORDER — CLOPIDOGREL BISULFATE 75 MG PO TABS: 75.0000 mg | ORAL_TABLET | Freq: Every day | ORAL | 3 refills | Status: DC

## 2018-02-05 NOTE — Progress Notes (Signed)
BP (!) 154/81   Pulse 62   Temp (!) 97.3 F (36.3 C) (Oral)   Ht 4' 11.5" (1.511 m)   Wt 140 lb (63.5 kg)   BMI 27.80 kg/m    Subjective:    Patient ID: Christy Love, female    DOB: 11-10-1950, 67 y.o.   MRN: [id number]  HPI: Christy Love is a 67 y.o. female presenting on 02/05/2018 for Gastroesophageal Reflux (3 MONTH FOLLOW UP); Diabetes; Hypertension; and Hospitalization Follow-up (10/4- [facility]- GERD)   HPI Type 2 diabetes mellitus Patient comes in today for recheck of his diabetes. Patient has been currently taking metformin. Patient is currently on an ACE inhibitor/ARB. Patient has not seen an ophthalmologist this year. Patient denies any issues with their feet.   Hypertension Patient is currently on lisinopril and amlodipine, and their blood pressure today is 154/81. Patient denies any lightheadedness or dizziness. Patient denies headaches, blurred vision, chest pains, shortness of breath, or weakness. Denies any side effects from medication and is content with current medication.   GERD Patient is currently on pantoprazole.  Just started after dysphagia from the emergency department  ER f/u for dysphagia Patient was in the emergency department at [facility] rocking him within the past few weeks with complaints of difficulty swallowing and was told it was related to her acid reflux and was recommended to go ahead and start on pantoprazole which they gave her and she is currently taking just barely starting it over the past few days.  She has not been able to determine whether it is helping yet or not but she only has the intermittent issue with difficulty swallowing.  Relevant past medical, surgical, family and social history reviewed and updated as indicated. Interim medical history since our last visit reviewed. Allergies and medications reviewed and updated.  Review of Systems  Constitutional: Negative for chills and fever.  HENT: Positive for trouble swallowing.     Eyes: Negative for visual disturbance.  Respiratory: Negative for chest tightness and shortness of breath.   Cardiovascular: Negative for chest pain and leg swelling.  Gastrointestinal: Positive for abdominal pain. Negative for abdominal distention, blood in stool, constipation, diarrhea, nausea and vomiting.  Musculoskeletal: Negative for back pain and gait problem.  Skin: Negative for rash.  Neurological: Negative for light-headedness and headaches.  Psychiatric/Behavioral: Negative for agitation and behavioral problems.  All other systems reviewed and are negative.   Per HPI unless specifically indicated above   Allergies as of 02/05/2018   No Known Allergies     Medication List        Accurate as of 02/05/18 11:59 PM. Always use your most recent med list.          amLODipine 5 MG tablet Commonly known as:  NORVASC Take 1 tablet (5 mg total) by mouth daily.   aspirin 81 MG chewable tablet Chew by mouth daily.   clopidogrel 75 MG tablet Commonly known as:  PLAVIX Take 1 tablet (75 mg total) by mouth daily.   lisinopril 10 MG tablet Commonly known as:  PRINIVIL,ZESTRIL Take 1 tablet (10 mg total) by mouth daily.   metFORMIN 1000 MG tablet Commonly known as:  GLUCOPHAGE Take 1 tablet (1,000 mg total) by mouth 2 (two) times daily with a meal.   simvastatin 5 MG tablet Commonly known as:  ZOCOR Take 1 tablet (5 mg total) by mouth daily.   VITAMIN B COMPLEX PO Take 1 tablet by mouth daily.  Objective:    BP (!) 154/81   Pulse 62   Temp (!) 97.3 F (36.3 C) (Oral)   Ht 4' 11.5" (1.511 m)   Wt 140 lb (63.5 kg)   BMI 27.80 kg/m   Wt Readings from Last 3 Encounters:  02/05/18 140 lb (63.5 kg)  11/01/17 139 lb 9.6 oz (63.3 kg)  08/02/17 144 lb (65.3 kg)    Physical Exam  Constitutional: She is oriented to person, place, and time. She appears well-developed and well-nourished. No distress.  Eyes: Conjunctivae are normal.  Neck: Neck supple. No  thyromegaly present.  Cardiovascular: Normal rate, regular rhythm, normal heart sounds and intact distal pulses.  No murmur heard. Pulmonary/Chest: Effort normal and breath sounds normal. No respiratory distress. She has no wheezes.  Abdominal: Soft. Bowel sounds are normal. She exhibits no distension and no mass. There is no tenderness. There is no guarding.  Musculoskeletal: Normal range of motion. She exhibits no edema or tenderness.  Lymphadenopathy:    She has no cervical adenopathy.  Neurological: She is alert and oriented to person, place, and time. Coordination normal.  Skin: Skin is warm and dry. No rash noted. She is not diaphoretic.  Psychiatric: She has a normal mood and affect. Her behavior is normal.  Nursing note and vitals reviewed.       Assessment & Plan:   Problem List Items Addressed This Visit      Cardiovascular and Mediastinum   Hypertension associated with diabetes (HCC)   Relevant Medications   simvastatin (ZOCOR) 5 MG tablet   metFORMIN (GLUCOPHAGE) 1000 MG tablet   amLODipine (NORVASC) 5 MG tablet     Digestive   GERD (gastroesophageal reflux disease)     Endocrine   Type 2 diabetes mellitus (HCC) - Primary   Relevant Medications   simvastatin (ZOCOR) 5 MG tablet   metFORMIN (GLUCOPHAGE) 1000 MG tablet   Other Relevant Orders   Bayer DCA Hb A1c Waived     Other   History of TIA (transient ischemic attack)   Relevant Medications   clopidogrel (PLAVIX) 75 MG tablet   simvastatin (ZOCOR) 5 MG tablet   Other Relevant Orders   Lipid panel      Continue Plavix and statin and metformin for diabetes and amlodipine and lisinopril for blood pressure. Follow up plan: Return in about 3 months (around 05/08/2018), or if symptoms worsen or fail to improve, for Diabetes recheck.  Counseling provided for all of the vaccine components Orders Placed This Encounter  Procedures  . Bayer DCA Hb A1c Waived  . Lipid panel    Arville Care, MD [facility] 02/11/2018, 10:29 PM

## 2018-02-06 NOTE — Telephone Encounter (Signed)
Mailed per pt

## 2018-02-14 ENCOUNTER — Encounter: Payer: PPO | Admitting: *Deleted

## 2018-05-08 ENCOUNTER — Ambulatory Visit (INDEPENDENT_AMBULATORY_CARE_PROVIDER_SITE_OTHER): Payer: PPO | Admitting: Family Medicine

## 2018-05-08 ENCOUNTER — Encounter: Payer: Self-pay | Admitting: Family Medicine

## 2018-05-08 VITALS — BP 143/81 | HR 72 | Temp 97.0°F | Ht 59.5 in | Wt 142.4 lb

## 2018-05-08 DIAGNOSIS — I10 Essential (primary) hypertension: Secondary | ICD-10-CM

## 2018-05-08 DIAGNOSIS — E1169 Type 2 diabetes mellitus with other specified complication: Secondary | ICD-10-CM | POA: Diagnosis not present

## 2018-05-08 DIAGNOSIS — E1159 Type 2 diabetes mellitus with other circulatory complications: Secondary | ICD-10-CM

## 2018-05-08 DIAGNOSIS — K219 Gastro-esophageal reflux disease without esophagitis: Secondary | ICD-10-CM | POA: Diagnosis not present

## 2018-05-08 LAB — BAYER DCA HB A1C WAIVED: HB A1C (BAYER DCA - WAIVED): 6.4 % (ref <7.0)

## 2018-05-08 NOTE — Progress Notes (Signed)
BP (!) 143/81   Pulse 72   Temp (!) 97 F (36.1 C) (Oral)   Ht 4' 11.5" (1.511 m)   Wt 142 lb 6.4 oz (64.6 kg)   BMI 28.28 kg/m    Subjective:    Patient ID: Christy Love, female    DOB: 1951-01-25, 68 y.o.   MRN: [id number]  HPI: Christy Love is a 68 y.o. female presenting on 05/08/2018 for Diabetes (3 month follow up) and Hypertension   HPI Type 2 diabetes mellitus Patient comes in today for recheck of his diabetes. Patient has been currently taking metformin. Patient is currently on an ACE inhibitor/ARB. Patient has not seen an ophthalmologist this year. Patient denies any issues with their feet.   Hypertension Patient is currently on amlodipine and lisinopril, and their blood pressure today is 143/81. Patient denies any lightheadedness or dizziness. Patient denies headaches, blurred vision, chest pains, shortness of breath, or weakness. Denies any side effects from medication and is content with current medication.   GERD Patient is currently on no medication currently.  She denies any major symptoms or abdominal pain or belching or burping. She denies any blood in her stool or lightheadedness or dizziness.   Relevant past medical, surgical, family and social history reviewed and updated as indicated. Interim medical history since our last visit reviewed. Allergies and medications reviewed and updated.  Review of Systems  Constitutional: Negative for chills and fever.  HENT: Negative for congestion, ear discharge and ear pain.   Eyes: Negative for redness and visual disturbance.  Respiratory: Negative for chest tightness and shortness of breath.   Cardiovascular: Negative for chest pain and leg swelling.  Genitourinary: Negative for difficulty urinating and dysuria.  Musculoskeletal: Negative for back pain and gait problem.  Skin: Negative for rash.  Neurological: Negative for dizziness, weakness, light-headedness, numbness and headaches.  Psychiatric/Behavioral:  Negative for agitation and behavioral problems.  All other systems reviewed and are negative.   Per HPI unless specifically indicated above   Allergies as of 05/08/2018   No Known Allergies     Medication List       Accurate as of May 08, 2018 11:32 AM. Always use your most recent med list.        amLODipine 5 MG tablet Commonly known as:  NORVASC Take 1 tablet (5 mg total) by mouth daily.   aspirin 81 MG chewable tablet Chew by mouth daily.   clopidogrel 75 MG tablet Commonly known as:  PLAVIX Take 1 tablet (75 mg total) by mouth daily.   lisinopril 10 MG tablet Commonly known as:  PRINIVIL,ZESTRIL Take 1 tablet (10 mg total) by mouth daily.   metFORMIN 1000 MG tablet Commonly known as:  GLUCOPHAGE Take 1 tablet (1,000 mg total) by mouth 2 (two) times daily with a meal.   simvastatin 5 MG tablet Commonly known as:  ZOCOR Take 1 tablet (5 mg total) by mouth daily.   VITAMIN B COMPLEX PO Take 1 tablet by mouth daily.          Objective:    BP (!) 143/81   Pulse 72   Temp (!) 97 F (36.1 C) (Oral)   Ht 4' 11.5" (1.511 m)   Wt 142 lb 6.4 oz (64.6 kg)   BMI 28.28 kg/m   Wt Readings from Last 3 Encounters:  05/08/18 142 lb 6.4 oz (64.6 kg)  02/05/18 140 lb (63.5 kg)  11/01/17 139 lb 9.6 oz (63.3 kg)    Physical  Exam Vitals signs and nursing note reviewed.  Constitutional:      General: She is not in acute distress.    Appearance: She is well-developed. She is not diaphoretic.  Eyes:     Conjunctiva/sclera: Conjunctivae normal.  Cardiovascular:     Rate and Rhythm: Normal rate and regular rhythm.     Heart sounds: Normal heart sounds. No murmur.  Pulmonary:     Effort: Pulmonary effort is normal. No respiratory distress.     Breath sounds: Normal breath sounds. No wheezing.  Musculoskeletal: Normal range of motion.        General: No tenderness.  Skin:    General: Skin is warm and dry.     Findings: No rash.  Neurological:     Mental  Status: She is alert and oriented to person, place, and time.     Coordination: Coordination normal.  Psychiatric:        Behavior: Behavior normal.          Assessment & Plan:   Problem List Items Addressed This Visit      Cardiovascular and Mediastinum   Hypertension associated with diabetes ([place])   Relevant Orders   CBC with Differential/Platelet   Lipid panel     Digestive   GERD (gastroesophageal reflux disease)   Relevant Orders   CBC with Differential/Platelet     Endocrine   Type 2 diabetes mellitus ([place]) - Primary   Relevant Orders   CMP14+EGFR   Lipid panel   Bayer DCA Hb A1c Waived       Follow up plan: Return in about 3 months (around 08/07/2018), or if symptoms worsen or fail to improve, for Diabetes and hypertension recheck.  Counseling provided for all of the vaccine components Orders Placed This Encounter  Procedures  . CBC with Differential/Platelet  . CMP14+EGFR  . Lipid panel  . Bayer [facility] Hb A1c [place], MD [facility] 05/08/2018, 11:32 AM

## 2018-05-09 LAB — CMP14+EGFR
A/G RATIO: 1.7 (ref 1.2–2.2)
ALBUMIN: 5 g/dL — AB (ref 3.6–4.8)
ALT: 18 IU/L (ref 0–32)
AST: 20 IU/L (ref 0–40)
Alkaline Phosphatase: 57 IU/L (ref 39–117)
BUN / CREAT RATIO: 15 (ref 12–28)
BUN: 13 mg/dL (ref 8–27)
Bilirubin Total: 0.3 mg/dL (ref 0.0–1.2)
CO2: 22 mmol/L (ref 20–29)
Calcium: 10.4 mg/dL — ABNORMAL HIGH (ref 8.7–10.3)
Chloride: 102 mmol/L (ref 96–106)
Creatinine, Ser: 0.89 mg/dL (ref 0.57–1.00)
GFR, EST AFRICAN AMERICAN: 78 mL/min/{1.73_m2} (ref >59)
GFR, EST NON AFRICAN AMERICAN: 67 mL/min/{1.73_m2} (ref >59)
GLOBULIN, TOTAL: 3 g/dL (ref 1.5–4.5)
Glucose: 102 mg/dL — ABNORMAL HIGH (ref 65–99)
POTASSIUM: 4.6 mmol/L (ref 3.5–5.2)
SODIUM: 143 mmol/L (ref 134–144)
TOTAL PROTEIN: 8 g/dL (ref 6.0–8.5)

## 2018-05-09 LAB — LIPID PANEL
CHOL/HDL RATIO: 2.2 ratio (ref 0.0–4.4)
Cholesterol, Total: 163 mg/dL (ref 100–199)
HDL: 73 mg/dL (ref >39)
LDL CALC: 71 mg/dL (ref 0–99)
Triglycerides: 94 mg/dL (ref 0–149)
VLDL Cholesterol Cal: 19 mg/dL (ref 5–40)

## 2018-05-09 LAB — CBC WITH DIFFERENTIAL/PLATELET
Basophils Absolute: 0 10*3/uL (ref 0.0–0.2)
Basos: 1 %
EOS (ABSOLUTE): 0.1 10*3/uL (ref 0.0–0.4)
EOS: 1 %
HEMATOCRIT: 37.1 % (ref 34.0–46.6)
Hemoglobin: 12.5 g/dL (ref 11.1–15.9)
Immature Grans (Abs): 0 10*3/uL (ref 0.0–0.1)
Immature Granulocytes: 0 %
LYMPHS ABS: 1.7 10*3/uL (ref 0.7–3.1)
Lymphs: 27 %
MCH: 26.5 pg — ABNORMAL LOW (ref 26.6–33.0)
MCHC: 33.7 g/dL (ref 31.5–35.7)
MCV: 79 fL (ref 79–97)
MONOS ABS: 0.5 10*3/uL (ref 0.1–0.9)
Monocytes: 7 %
Neutrophils Absolute: 4 10*3/uL (ref 1.4–7.0)
Neutrophils: 64 %
Platelets: 353 10*3/uL (ref 150–450)
RBC: 4.71 x10E6/uL (ref 3.77–5.28)
RDW: 15.1 % (ref 11.7–15.4)
WBC: 6.3 10*3/uL (ref 3.4–10.8)

## 2018-05-10 ENCOUNTER — Other Ambulatory Visit: Payer: Self-pay | Admitting: Family Medicine

## 2018-05-10 DIAGNOSIS — E1159 Type 2 diabetes mellitus with other circulatory complications: Secondary | ICD-10-CM

## 2018-05-10 DIAGNOSIS — I152 Hypertension secondary to endocrine disorders: Secondary | ICD-10-CM

## 2018-05-10 DIAGNOSIS — I10 Essential (primary) hypertension: Principal | ICD-10-CM

## 2018-07-26 ENCOUNTER — Telehealth: Payer: PPO

## 2018-08-01 ENCOUNTER — Ambulatory Visit: Payer: PPO | Admitting: *Deleted

## 2018-08-01 DIAGNOSIS — Z8673 Personal history of transient ischemic attack (TIA), and cerebral infarction without residual deficits: Secondary | ICD-10-CM

## 2018-08-01 DIAGNOSIS — I10 Essential (primary) hypertension: Secondary | ICD-10-CM

## 2018-08-01 DIAGNOSIS — E1169 Type 2 diabetes mellitus with other specified complication: Secondary | ICD-10-CM

## 2018-08-01 DIAGNOSIS — E785 Hyperlipidemia, unspecified: Secondary | ICD-10-CM

## 2018-08-01 DIAGNOSIS — I152 Hypertension secondary to endocrine disorders: Secondary | ICD-10-CM

## 2018-08-01 DIAGNOSIS — E1159 Type 2 diabetes mellitus with other circulatory complications: Secondary | ICD-10-CM

## 2018-08-01 NOTE — Chronic Care Management (AMB) (Signed)
  Care Management Note   Christy Love is a 68 y.o. year old female who is a primary care patient of Dettinger, Fransisca Kaufmann, MD. The CM team was consulted for assistance with chronic disease management and care coordination associated with HTN, DM, hyperlipidemia, and hx of TIA.   I reached out to [place]. Yellin by phone today.   Ms. Skarda was given information about Chronic Care Management services today including:  1. CCM service includes personalized support from designated clinical staff supervised by her physician, including individualized plan of care and coordination with other care providers 2. 24/7 contact phone numbers for assistance for urgent and routine care needs. 3. Service will only be billed when office clinical staff spend 20 minutes or more in a month to coordinate care. 4. Only one practitioner may furnish and bill the service in a calendar month. 5. The patient may stop CCM services at any time (effective at the end of the month) by phone call to the office staff. 6. The patient will be responsible for cost sharing (co-pay) of up to 20% of the service fee (after annual deductible is met)  Patient agreed to services and verbal consent obtained.  She is interested in CCM services and is optimistic about what we can accomplish together. She has a follow up appointment with Dr Dettinger on 08/08/18. Over the next week she will write down some goals or things she would like to improve on.    Follow Up Plan:  I will talk with patient at face to face visit with Dr Dettinger ron 08/08/18. If patient is scheduled for a telephone visit due to COVID-19, then I will reach out by telephone.   Chong Sicilian, RN-BC, BSN Nurse Case Manager [place] [phone]

## 2018-08-01 NOTE — Patient Instructions (Addendum)
Visit Information   Ms. Daniele was given information about Chronic Care Management services today including:  1. CCM service includes personalized support from designated clinical staff supervised by her physician, including individualized plan of care and coordination with other care providers 2. 24/7 contact phone numbers for assistance for urgent and routine care needs. 3. Service will only be billed when office clinical staff spend 20 minutes or more in a month to coordinate care. 4. Only one practitioner may furnish and bill the service in a calendar month. 5. The patient may stop CCM services at any time (effective at the end of the month) by phone call to the office staff. 6. The patient will be responsible for cost sharing (co-pay) of up to 20% of the service fee (after annual deductible is met).  Patient agreed to services and verbal consent obtained.   Follow up Plan Patient will write down goals and things she would like to improve on.  RNCM will meet with patient at visit with Dr Dettinger on 08/08/18 or will speak with patient via telephone if visit is adjusted due to [place].  Chong Sicilian, RN-BC, BSN Nurse Case Manager [place] [phone]

## 2018-08-08 ENCOUNTER — Other Ambulatory Visit: Payer: Self-pay

## 2018-08-08 ENCOUNTER — Ambulatory Visit: Payer: PPO | Admitting: Family Medicine

## 2018-08-08 ENCOUNTER — Telehealth: Payer: PPO | Admitting: *Deleted

## 2018-08-09 ENCOUNTER — Encounter: Payer: Self-pay | Admitting: Family Medicine

## 2018-08-09 ENCOUNTER — Ambulatory Visit (INDEPENDENT_AMBULATORY_CARE_PROVIDER_SITE_OTHER): Payer: PPO | Admitting: Family Medicine

## 2018-08-09 VITALS — BP 148/81 | HR 74 | Temp 98.0°F | Ht 59.5 in | Wt 143.2 lb

## 2018-08-09 DIAGNOSIS — R5381 Other malaise: Secondary | ICD-10-CM | POA: Diagnosis not present

## 2018-08-09 DIAGNOSIS — E1169 Type 2 diabetes mellitus with other specified complication: Secondary | ICD-10-CM

## 2018-08-09 DIAGNOSIS — R5383 Other fatigue: Secondary | ICD-10-CM

## 2018-08-09 DIAGNOSIS — I10 Essential (primary) hypertension: Secondary | ICD-10-CM

## 2018-08-09 DIAGNOSIS — R42 Dizziness and giddiness: Secondary | ICD-10-CM

## 2018-08-09 DIAGNOSIS — E785 Hyperlipidemia, unspecified: Secondary | ICD-10-CM

## 2018-08-09 DIAGNOSIS — R0989 Other specified symptoms and signs involving the circulatory and respiratory systems: Secondary | ICD-10-CM | POA: Diagnosis not present

## 2018-08-09 DIAGNOSIS — E1159 Type 2 diabetes mellitus with other circulatory complications: Secondary | ICD-10-CM | POA: Diagnosis not present

## 2018-08-09 DIAGNOSIS — J9601 Acute respiratory failure with hypoxia: Secondary | ICD-10-CM | POA: Diagnosis not present

## 2018-08-09 DIAGNOSIS — G7281 Critical illness myopathy: Secondary | ICD-10-CM | POA: Diagnosis not present

## 2018-08-09 LAB — BAYER DCA HB A1C WAIVED: HB A1C (BAYER DCA - WAIVED): 6.3 % (ref <7.0)

## 2018-08-09 NOTE — Progress Notes (Signed)
BP (!) 148/81   Pulse 74   Temp 98 F (36.7 C) (Oral)   Ht 4' 11.5" (1.511 m)   Wt 143 lb 3.2 oz (65 kg)   BMI 28.44 kg/m    Subjective:   Patient ID: Christy Love, female    DOB: Mar 04, 1951, 68 y.o.   MRN: [id number]  HPI: Christy Love is a 68 y.o. female presenting on 08/09/2018 for Diabetes (3 month follow up); Hypertension; and Fatigue (Patient states it has been on and off since Christmas )   HPI Type 2 diabetes mellitus Patient comes in today for recheck of his diabetes. Patient has been currently taking metformin. Patient is currently on an ACE inhibitor/ARB. Patient has not seen an ophthalmologist this year. Patient denies any issues with their feet.   Hypertension Patient is currently on amlodipine and lisinopril, and their blood pressure today is 148/81. Patient denies any lightheadedness or dizziness. Patient denies headaches, blurred vision, chest pains, shortness of breath, or weakness. Denies any side effects from medication and is content with current medication.   Hyperlipidemia Patient is coming in for recheck of his hyperlipidemia. The patient is currently taking simvastatin. They deny any issues with myalgias or history of liver damage from it. They deny any focal numbness or weakness or chest pain.   Dizziness and fatigue Patient has been having spells of intermittent dizziness and fatigue is been happening over the past year or 2 but it happened so infrequently that she had not brought it up before now.  She says it happens once every few weeks or month and usually happens on exertion.  She says she does not have trouble otherwise on exertion.  Patient will take her blood pressure and her blood sugar when this happens and her heart rate and they will all be within normal limits.  She denies any chest pain or shortness of breath.  She says she will feel little lightheaded and weak and then stop for second and will pass quickly.  Relevant past medical,  surgical, family and social history reviewed and updated as indicated. Interim medical history since our last visit reviewed. Allergies and medications reviewed and updated.  Review of Systems  Constitutional: Positive for fatigue. Negative for chills and fever.  HENT: Negative for congestion.   Eyes: Negative for visual disturbance.  Respiratory: Negative for chest tightness and shortness of breath.   Cardiovascular: Negative for chest pain and leg swelling.  Genitourinary: Negative for difficulty urinating, dysuria and hematuria.  Musculoskeletal: Negative for back pain and gait problem.  Skin: Negative for rash.  Neurological: Positive for dizziness and light-headedness. Negative for seizures, weakness, numbness and headaches.  Psychiatric/Behavioral: Negative for agitation and behavioral problems.  All other systems reviewed and are negative.   Per HPI unless specifically indicated above   Allergies as of 08/09/2018   No Known Allergies     Medication List       Accurate as of August 09, 2018 10:36 AM. Always use your most recent med list.        amLODipine 5 MG tablet Commonly known as:  NORVASC TAKE 1 TABLET BY MOUTH EVERY DAY   aspirin 81 MG chewable tablet Chew by mouth daily.   clopidogrel 75 MG tablet Commonly known as:  PLAVIX Take 1 tablet (75 mg total) by mouth daily.   lisinopril 10 MG tablet Commonly known as:  PRINIVIL,ZESTRIL Take 1 tablet (10 mg total) by mouth daily.   metFORMIN 1000 MG tablet Commonly  known as:  GLUCOPHAGE Take 1 tablet (1,000 mg total) by mouth 2 (two) times daily with a meal.   simvastatin 5 MG tablet Commonly known as:  Zocor Take 1 tablet (5 mg total) by mouth daily.   VITAMIN B COMPLEX PO Take 1 tablet by mouth daily.        Objective:   BP (!) 148/81   Pulse 74   Temp 98 F (36.7 C) (Oral)   Ht 4' 11.5" (1.511 m)   Wt 143 lb 3.2 oz (65 kg)   BMI 28.44 kg/m   Wt Readings from Last 3 Encounters:  08/09/18 143  lb 3.2 oz (65 kg)  05/08/18 142 lb 6.4 oz (64.6 kg)  02/05/18 140 lb (63.5 kg)    Physical Exam Vitals signs and nursing note reviewed.  Constitutional:      General: She is not in acute distress.    Appearance: She is well-developed. She is not diaphoretic.  Eyes:     Conjunctiva/sclera: Conjunctivae normal.  Cardiovascular:     Rate and Rhythm: Normal rate and regular rhythm.     Heart sounds: Normal heart sounds. No murmur.  Pulmonary:     Effort: Pulmonary effort is normal. No respiratory distress.     Breath sounds: Normal breath sounds. No wheezing.  Neurological:     Mental Status: She is alert and oriented to person, place, and time.     Coordination: Coordination normal.  Psychiatric:        Behavior: Behavior normal.       Assessment & Plan:   Problem List Items Addressed This Visit      Cardiovascular and Mediastinum   Hypertension associated with diabetes ([place])   Relevant Orders   CMP14+EGFR     Endocrine   Type 2 diabetes mellitus ([place]) - Primary   Relevant Orders   CMP14+EGFR   Bayer DCA Hb A1c Waived     Other   Hyperlipidemia   Relevant Orders   Lipid panel    Other Visit Diagnoses    Dizziness       Relevant Orders   Ambulatory referral to Neurology   CBC with Differential/Platelet   CMP14+EGFR   TSH   Other fatigue       Relevant Orders   CBC with Differential/Platelet   TSH      Will check blood work but based on her symptoms of intermittent every 3 weeks one-time fatigue, I be more concerned with something neurological, it does not sound cardiac in nature except for the fact that it does sometimes occur on exertion.  Patient had a normal echocardiogram in 2018 but will be unlikely to get one right now due to the coronavirus cancellations.  This could also be an issue if she was having some kind of valvular abnormality. Follow up plan: Return in about 3 months (around 11/08/2018), or if symptoms worsen or fail to improve, for Diabetes and  hypertension and cholesterol.  Counseling provided for all of the vaccine components Orders Placed This Encounter  Procedures  . CBC with Differential/Platelet  . CMP14+EGFR  . Lipid panel  . Bayer DCA Hb A1c Waived  . TSH  . Ambulatory referral to Neurology    Caryl Pina, MD [facility] 08/09/2018, 10:36 AM

## 2018-08-10 LAB — LIPID PANEL
Chol/HDL Ratio: 2.3 ratio (ref 0.0–4.4)
Cholesterol, Total: 147 mg/dL (ref 100–199)
HDL: 64 mg/dL (ref >39)
LDL Calculated: 68 mg/dL (ref 0–99)
Triglycerides: 76 mg/dL (ref 0–149)
VLDL Cholesterol Cal: 15 mg/dL (ref 5–40)

## 2018-08-10 LAB — CMP14+EGFR
ALT: 14 IU/L (ref 0–32)
AST: 13 IU/L (ref 0–40)
Albumin/Globulin Ratio: 1.4 (ref 1.2–2.2)
Albumin: 4.5 g/dL (ref 3.8–4.8)
Alkaline Phosphatase: 54 IU/L (ref 39–117)
BUN/Creatinine Ratio: 9 — ABNORMAL LOW (ref 12–28)
BUN: 9 mg/dL (ref 8–27)
Bilirubin Total: 0.2 mg/dL (ref 0.0–1.2)
CO2: 22 mmol/L (ref 20–29)
Calcium: 10.2 mg/dL (ref 8.7–10.3)
Chloride: 100 mmol/L (ref 96–106)
Creatinine, Ser: 1 mg/dL (ref 0.57–1.00)
GFR calc Af Amer: 67 mL/min/{1.73_m2} (ref >59)
GFR calc non Af Amer: 58 mL/min/{1.73_m2} — ABNORMAL LOW (ref >59)
Globulin, Total: 3.2 g/dL (ref 1.5–4.5)
Glucose: 141 mg/dL — ABNORMAL HIGH (ref 65–99)
Potassium: 4.6 mmol/L (ref 3.5–5.2)
Sodium: 141 mmol/L (ref 134–144)
Total Protein: 7.7 g/dL (ref 6.0–8.5)

## 2018-08-10 LAB — CBC WITH DIFFERENTIAL/PLATELET
Basophils Absolute: 0 10*3/uL (ref 0.0–0.2)
Basos: 1 %
EOS (ABSOLUTE): 0.1 10*3/uL (ref 0.0–0.4)
Eos: 1 %
Hematocrit: 38.7 % (ref 34.0–46.6)
Hemoglobin: 12.4 g/dL (ref 11.1–15.9)
Immature Grans (Abs): 0 10*3/uL (ref 0.0–0.1)
Immature Granulocytes: 0 %
Lymphocytes Absolute: 1.9 10*3/uL (ref 0.7–3.1)
Lymphs: 32 %
MCH: 26.3 pg — ABNORMAL LOW (ref 26.6–33.0)
MCHC: 32 g/dL (ref 31.5–35.7)
MCV: 82 fL (ref 79–97)
Monocytes Absolute: 0.4 10*3/uL (ref 0.1–0.9)
Monocytes: 7 %
Neutrophils Absolute: 3.4 10*3/uL (ref 1.4–7.0)
Neutrophils: 59 %
Platelets: 341 10*3/uL (ref 150–450)
RBC: 4.72 x10E6/uL (ref 3.77–5.28)
RDW: 13.8 % (ref 11.7–15.4)
WBC: 5.8 10*3/uL (ref 3.4–10.8)

## 2018-08-10 LAB — TSH: TSH: 1.48 u[IU]/mL (ref 0.450–4.500)

## 2018-10-16 ENCOUNTER — Institutional Professional Consult (permissible substitution): Payer: PPO | Admitting: Neurology

## 2018-10-31 ENCOUNTER — Encounter

## 2018-10-31 ENCOUNTER — Other Ambulatory Visit: Payer: Self-pay

## 2018-10-31 ENCOUNTER — Ambulatory Visit (INDEPENDENT_AMBULATORY_CARE_PROVIDER_SITE_OTHER): Payer: PPO | Admitting: Neurology

## 2018-10-31 ENCOUNTER — Encounter: Payer: Self-pay | Admitting: Neurology

## 2018-10-31 VITALS — BP 130/80 | HR 96 | Temp 97.2°F | Ht 59.5 in | Wt 146.5 lb

## 2018-10-31 DIAGNOSIS — R5383 Other fatigue: Secondary | ICD-10-CM

## 2018-10-31 DIAGNOSIS — I63322 Cerebral infarction due to thrombosis of left anterior cerebral artery: Secondary | ICD-10-CM | POA: Diagnosis not present

## 2018-10-31 NOTE — Progress Notes (Signed)
PATIENT: Christy Love DOB: 04/04/1951  Chief Complaint  Patient presents with  . Hx of CVA/Weakness    Reports intermittent episodes of lightheadedness and weakness that only occur every few weeks to a month.  The episodes are usually with exertion and the symptoms quickly pass.  She tends to feel fatigue afterwards and sometimes will need to take a nap.  Occasionally, she has noticed her speech to be slow but this is not always related to her other symptoms. She has checked her vital during these events and they are in normal range (BP, pulse).  Her blood sugars have also been normal.  . PCP    Dettinger, Elige RadonJoshua A, MD     HISTORICAL  Christy Love is a 68 year old female, seen in request by her primary care physician Dr. Louanne Skyeettinger, Ivin BootyJoshua A for evaluation of stroke, weakness, initial evaluation was on October 31, 2018.  I have reviewed and summarized the referring note from the referring physician.  She had a past medical history of hypertension, hyperlipidemia, type 2 diabetes, she also had a history of stroke.     on January 26, 2017, when she stepped out of the car she noticed mild right leg weakness, could not bear weight, dragging her right leg while walking, she presented to the emergency room next day for persistent symptoms,   I personally reviewed MRI of the brain October 2018, acute left anterior cerebral artery stroke, stroke with chronic hemo-product, chronic declining functioning right thalamus, chronic microhemorrhage in the left thalamus  Laboratory evaluations in April 2020, normal TSH, A1C 6.3, LDL 68. CMP, glucose 141, CBC, Hg 12.4  Echocardiogram October 2018: Ejection fraction 50 to 55%, wall thickness was normal, the size was mildly dilated.  Distal septal hypokinesia  Ultrasound of carotid artery: No significant abnormality  She has been taking aspirin and Plavix since the event,  Today she complains of intermittent fatigue since her stroke in 2018,  gradually getting worse, now after she take a shower she would have to lie down for 30 minutes otherwise she would feel weakness waving over her arms and legs,  She denies visual loss, she continue has mild right arm, leg weakness   REVIEW OF SYSTEMS: Full 14 system review of systems performed and notable only for as above All other review of systems were negative.  ALLERGIES: No Known Allergies  HOME MEDICATIONS: Current Outpatient Medications  Medication Sig Dispense Refill  . amLODipine (NORVASC) 5 MG tablet TAKE 1 TABLET BY MOUTH EVERY DAY 90 tablet 1  . aspirin 81 MG chewable tablet Chew by mouth daily.    . B Complex Vitamins (VITAMIN B COMPLEX PO) Take 1 tablet by mouth daily.    . clopidogrel (PLAVIX) 75 MG tablet Take 1 tablet (75 mg total) by mouth daily. 90 tablet 3  . lisinopril (PRINIVIL,ZESTRIL) 10 MG tablet Take 1 tablet (10 mg total) by mouth daily. 90 tablet 3  . metFORMIN (GLUCOPHAGE) 1000 MG tablet Take 1 tablet (1,000 mg total) by mouth 2 (two) times daily with a meal. 180 tablet 3  . simvastatin (ZOCOR) 5 MG tablet Take 1 tablet (5 mg total) by mouth daily. 90 tablet 3   No current facility-administered medications for this visit.     PAST MEDICAL HISTORY: Past Medical History:  Diagnosis Date  . Cataract   . Diabetes mellitus without complication (HCC)   . Diastolic congestive heart failure (HCC)   . Hyperlipidemia   . Hypertension   .  TIA (transient ischemic attack)     PAST SURGICAL HISTORY: Past Surgical History:  Procedure Laterality Date  . ABDOMINAL HYSTERECTOMY    . EYE SURGERY Bilateral    cataracts  . TUBAL LIGATION      FAMILY HISTORY: Family History  Problem Relation Age of Onset  . Diabetes Mother   . Glaucoma Mother   . Dementia Mother   . Stroke Mother   . Diabetes Father   . Heart disease Father   . Heart failure Father   . Diabetes Sister   . Diabetes Brother     SOCIAL HISTORY: Social History   Socioeconomic History   . Marital status: Widowed    Spouse name: Not on file  . Number of children: 1  . Years of education: Bachelors  . Highest education level: Not on file  Occupational History  . Occupation: Retired  Engineer, productionocial Needs  . Financial resource strain: Not on file  . Food insecurity    Worry: Not on file    Inability: Not on file  . Transportation needs    Medical: Not on file    Non-medical: Not on file  Tobacco Use  . Smoking status: Never Smoker  . Smokeless tobacco: Never Used  Substance and Sexual Activity  . Alcohol use: Yes    Comment: occasional socially  . Drug use: No  . Sexual activity: Never  Lifestyle  . Physical activity    Days per week: Not on file    Minutes per session: Not on file  . Stress: Not on file  Relationships  . Social Musicianconnections    Talks on phone: Not on file    Gets together: Not on file    Attends religious service: Not on file    Active member of club or organization: Not on file    Attends meetings of clubs or organizations: Not on file    Relationship status: Not on file  . Intimate partner violence    Fear of current or ex partner: Not on file    Emotionally abused: Not on file    Physically abused: Not on file    Forced sexual activity: Not on file  Other Topics Concern  . Not on file  Social History Narrative   Lives at home alone.   Right-handed.   Drinks a soda on occasion.     PHYSICAL EXAM   Vitals:   10/31/18 1252  BP: 130/80  Pulse: 96  Temp: (!) 97.2 F (36.2 C)  Weight: 146 lb 8 oz (66.5 kg)  Height: 4' 11.5" (1.511 m)    Not recorded      Body mass index is 29.09 kg/m.  PHYSICAL EXAMNIATION:  Gen: NAD, conversant, well nourised, obese, well groomed                     Cardiovascular: Regular rate rhythm, no peripheral edema, warm, nontender. Eyes: Conjunctivae clear without exudates or hemorrhage Neck: Supple, no carotid bruits. Pulmonary: Clear to auscultation bilaterally   NEUROLOGICAL EXAM:  MENTAL  STATUS: Speech:    Speech is normal; fluent and spontaneous with normal comprehension.  Cognition:     Orientation to time, place and person     Normal recent and remote memory     Normal Attention span and concentration     Normal Language, naming, repeating,spontaneous speech     Fund of knowledge   CRANIAL NERVES: CN II: Visual fields are full to confrontation.  Pupils are  round equal and briskly reactive to light. CN III, IV, VI: extraocular movement are normal. No ptosis. CN V: Facial sensation is intact to pinprick in all 3 divisions bilaterally. Corneal responses are intact.  CN VII: Face is symmetric with normal eye closure and smile. CN VIII: Hearing is normal to rubbing fingers CN IX, X: Palate elevates symmetrically. Phonation is normal. CN XI: Head turning and shoulder shrug are intact CN XII: Tongue is midline with normal movements and no atrophy.  MOTOR: There is no pronator drift of out-stretched arms. Muscle bulk and tone are normal. Muscle strength is normal.  REFLEXES: Reflexes are 1 and symmetric at the biceps, triceps, knees, and ankles. Plantar responses are flexor.  SENSORY: Intact to light touch, pinprick and vibratory sensation are intact in fingers and toes.  COORDINATION: Rapid alternating movements and fine finger movements are intact. There is no dysmetria on finger-to-nose and heel-knee-shin.    GAIT/STANCE: Posture is normal, gait is steady, mild difficulty perform right side tiptoe and heel walking, mild difficulty with tandem walking.   DIAGNOSTIC DATA (LABS, IMAGING, TESTING) - I reviewed patient records, labs, notes, testing and imaging myself where available.   ASSESSMENT AND PLAN  Christy Love is a 68 y.o. female   Left ACA stroke on January 26, 2017  She has vascular risk factors of hypertension hyperlipidemia, diabetes,  Continue Plavix single agent alone  Excessive fatigue  Laboratory evaluations  Echocardiogram  I have  encouraged her moderate exercise   Marcial Pacas, M.D. Ph.D.  [facility] [address] [place], [place] [address] Ph: [phone] Fax: [phone]  CC: Dettinger, Fransisca Kaufmann, MD

## 2018-11-01 ENCOUNTER — Telehealth: Payer: PPO | Admitting: *Deleted

## 2018-11-01 ENCOUNTER — Telehealth: Payer: Self-pay | Admitting: Neurology

## 2018-11-01 LAB — RPR: RPR Ser Ql: NONREACTIVE

## 2018-11-01 LAB — CK: Total CK: 72 U/L (ref 32–182)

## 2018-11-01 LAB — IRON AND TIBC
Iron Saturation: 19 % (ref 15–55)
Iron: 66 ug/dL (ref 27–139)
Total Iron Binding Capacity: 347 ug/dL (ref 250–450)
UIBC: 281 ug/dL (ref 118–369)

## 2018-11-01 LAB — FERRITIN: Ferritin: 76 ng/mL (ref 15–150)

## 2018-11-01 LAB — SEDIMENTATION RATE: Sed Rate: 9 mm/hr (ref 0–40)

## 2018-11-01 LAB — VITAMIN B12: Vitamin B-12: 837 pg/mL (ref 232–1245)

## 2018-11-01 LAB — C-REACTIVE PROTEIN: CRP: 2 mg/L (ref 0–10)

## 2018-11-01 LAB — VITAMIN D 25 HYDROXY (VIT D DEFICIENCY, FRACTURES): Vit D, 25-Hydroxy: 9.7 ng/mL — ABNORMAL LOW (ref 30.0–100.0)

## 2018-11-01 LAB — ANA W/REFLEX IF POSITIVE: Anti Nuclear Antibody (ANA): NEGATIVE

## 2018-11-01 NOTE — Telephone Encounter (Signed)
Left message requesting a return call.

## 2018-11-01 NOTE — Telephone Encounter (Signed)
I have spoken with patient.  She is aware of her lab results and agreeable to start the recommended supplement.

## 2018-11-01 NOTE — Telephone Encounter (Signed)
Please call patient, laboratory evaluation showed significant vitamin D deficiency, level was 10, rest of the laboratory evaluations were normal.  She would benefit vitamin D supplement, 1000 units, 2 tablets daily

## 2018-11-09 ENCOUNTER — Telehealth: Payer: PPO

## 2018-12-10 ENCOUNTER — Other Ambulatory Visit: Payer: Self-pay | Admitting: Family Medicine

## 2018-12-10 DIAGNOSIS — E1159 Type 2 diabetes mellitus with other circulatory complications: Secondary | ICD-10-CM

## 2018-12-10 DIAGNOSIS — I152 Hypertension secondary to endocrine disorders: Secondary | ICD-10-CM

## 2018-12-20 ENCOUNTER — Other Ambulatory Visit: Payer: Self-pay

## 2018-12-21 ENCOUNTER — Telehealth: Payer: Self-pay | Admitting: Family Medicine

## 2018-12-21 NOTE — Telephone Encounter (Signed)
Brother aware

## 2018-12-21 NOTE — Telephone Encounter (Signed)
If her cough is no worse than it has been before that and she can come

## 2018-12-24 ENCOUNTER — Ambulatory Visit: Payer: PPO | Admitting: Family Medicine

## 2019-01-01 ENCOUNTER — Ambulatory Visit (INDEPENDENT_AMBULATORY_CARE_PROVIDER_SITE_OTHER): Payer: PPO | Admitting: *Deleted

## 2019-01-01 DIAGNOSIS — Z0000 Encounter for general adult medical examination without abnormal findings: Secondary | ICD-10-CM | POA: Diagnosis not present

## 2019-01-01 NOTE — Progress Notes (Addendum)
MEDICARE ANNUAL WELLNESS VISIT  01/01/2019  Telephone Visit Disclaimer This Medicare AWV was conducted by telephone due to national recommendations for restrictions regarding the COVID-19 Pandemic (e.g. social distancing).  I verified, using two identifiers, that I am speaking with Christy Love or their authorized healthcare agent. I discussed the limitations, risks, security, and privacy concerns of performing an evaluation and management service by telephone and the potential availability of an in-person appointment in the future. The patient expressed understanding and agreed to proceed.   Subjective:  Christy Love is a 68 y.o. female patient of Dettinger, Fransisca Kaufmann, MD who had a Medicare Annual Wellness Visit today via telephone. Christy Love is Retired and lives alone. she has 1 child. she reports that she is socially active and does interact with friends/family regularly. she is not physically active and enjoys reading and watching movies.  Patient Care Team: Dettinger, Fransisca Kaufmann, MD as PCP - General (Family Medicine) Danie Binder, MD as Consulting Physician (Gastroenterology) Ilean China, RN as Case Manager  Advanced Directives 01/01/2019 02/13/2017  Does Patient Have a Medical Advance Directive? No No  Would patient like information on creating a medical advance directive? No - Patient declined Yes (MAU/Ambulatory/Procedural Areas - Information given)    Hospital Utilization Over the Past 12 Months: # of hospitalizations or ER visits: 0 # of surgeries: 0  Review of Systems    Patient reports that her overall health is worse compared to last year.  History obtained from chart review  Patient Reported Readings (BP, Pulse, CBG, Weight, etc) none  Pain Assessment       Current Medications & Allergies (verified) Allergies as of 01/01/2019   No Known Allergies     Medication List       Accurate as of January 01, 2019  2:48 PM. If you have any questions,  ask your nurse or doctor.        amLODipine 5 MG tablet Commonly known as: NORVASC TAKE 1 TABLET BY MOUTH EVERY DAY   aspirin 81 MG chewable tablet Chew by mouth daily.   clopidogrel 75 MG tablet Commonly known as: PLAVIX Take 1 tablet (75 mg total) by mouth daily.   lisinopril 10 MG tablet Commonly known as: ZESTRIL Take 1 tablet (10 mg total) by mouth daily.   metFORMIN 1000 MG tablet Commonly known as: GLUCOPHAGE Take 1 tablet (1,000 mg total) by mouth 2 (two) times daily with a meal.   simvastatin 5 MG tablet Commonly known as: Zocor Take 1 tablet (5 mg total) by mouth daily.   VITAMIN B COMPLEX PO Take 1 tablet by mouth daily.   VITAMIN D PO Take 2,000 Units by mouth daily.       History (reviewed): Past Medical History:  Diagnosis Date  . Cataract   . Diabetes mellitus without complication ([place])   . Diastolic congestive heart failure ([place])   . Hyperlipidemia   . Hypertension   . TIA (transient ischemic attack)    Past Surgical History:  Procedure Laterality Date  . ABDOMINAL HYSTERECTOMY    . EYE SURGERY Bilateral    cataracts  . TUBAL LIGATION     Family History  Problem Relation Age of Onset  . Diabetes Mother   . Glaucoma Mother   . Dementia Mother   . Stroke Mother   . Diabetes Father   . Heart disease Father   . Heart failure Father   . Diabetes Sister   . Diabetes Brother  Social History   Socioeconomic History  . Marital status: Widowed    Spouse name: Not on file  . Number of children: 1  . Years of education: Bachelors  . Highest education level: Bachelor's degree (e.g., BA, AB, BS)  Occupational History  . Occupation: Retired  Engineer, productionocial Needs  . Financial resource strain: Not hard at all  . Food insecurity    Worry: Never true    Inability: Never true  . Transportation needs    Medical: No    Non-medical: No  Tobacco Use  . Smoking status: Never Smoker  . Smokeless tobacco: Never Used  Substance and Sexual Activity   . Alcohol use: Not Currently    Comment: occasional socially  . Drug use: No  . Sexual activity: Not Currently  Lifestyle  . Physical activity    Days per week: 0 days    Minutes per session: 0 min  . Stress: Not at all  Relationships  . Social connections    Talks on phone: More than three times a week    Gets together: More than three times a week    Attends religious service: More than 4 times per year    Active member of club or organization: Yes    Attends meetings of clubs or organizations: More than 4 times per year    Relationship status: Widowed  Other Topics Concern  . Not on file  Social History Narrative   Lives at home alone.   Right-handed.   Drinks a soda on occasion.    Activities of Daily Living In your present state of health, do you have any difficulty performing the following activities: 01/01/2019  Hearing? N  Vision? N  Difficulty concentrating or making decisions? N  Walking or climbing stairs? N  Dressing or bathing? N  Doing errands, shopping? N  Preparing Food and eating ? N  Using the Toilet? N  In the past six months, have you accidently leaked urine? N  Do you have problems with loss of bowel control? N  Managing your Medications? N  Managing your Finances? N  Housekeeping or managing your Housekeeping? N  Some recent data might be hidden    Patient Education/ Literacy How often do you need to have someone help you when you read instructions, pamphlets, or other written materials from your doctor or pharmacy?: 1 - Never What is the last grade level you completed in school?: Bachelors Degree  Exercise Current Exercise Habits: The patient does not participate in regular exercise at present, Exercise limited by: None identified  Diet Patient reports consuming 3 meals a day and 1 snack(s) a day Patient reports that her primary diet is: Regular Patient reports that she does have regular access to food.   Depression Screen PHQ 2/9 Scores  01/01/2019 08/09/2018 05/08/2018 02/05/2018 11/01/2017 08/02/2017 05/03/2017  PHQ - 2 Score 0 0 0 0 0 0 0  PHQ- 9 Score - - - - - - -     Fall Risk Fall Risk  01/01/2019 02/05/2018 11/01/2017 05/03/2017 02/13/2017  Falls in the past year? 0 No No Yes Yes  Comment - - - - Slipped standing on tub removing curtain, and tripped on step with laundry basket  Number falls in past yr: 0 - - 2 or more 2 or more  Injury with Fall? 0 - - Yes No  Comment - - - hip pain, abrasions -  Risk for fall due to : - - - - Impaired balance/gait;History  of fall(s)  Follow up Falls prevention discussed - - - Falls prevention discussed  Comment no throw rugs in the house, adequate lighting in the house and grab bars in the bathroom - - - -     Objective:  Christy Love seemed alert and oriented and she participated appropriately during our telephone visit.  Blood Pressure Weight BMI  BP Readings from Last 3 Encounters:  10/31/18 130/80  08/09/18 (!) 148/81  05/08/18 (!) 143/81   Wt Readings from Last 3 Encounters:  10/31/18 146 lb 8 oz (66.5 kg)  08/09/18 143 lb 3.2 oz (65 kg)  05/08/18 142 lb 6.4 oz (64.6 kg)   BMI Readings from Last 1 Encounters:  10/31/18 29.09 kg/m    *Unable to obtain current vital signs, weight, and BMI due to telephone visit type  Hearing/Vision  . Christy Love did not seem to have difficulty with hearing/understanding during the telephone conversation . Reports that she has not had a formal eye exam by an eye care professional within the past year . Reports that she has not had a formal hearing evaluation within the past year *Unable to fully assess hearing and vision during telephone visit type  Cognitive Function: 6CIT Screen 01/01/2019  What Year? 0 points  What month? 0 points  What time? 0 points  Count back from 20 0 points  Months in reverse 0 points  Repeat phrase 2 points  Total Score 2   (Normal:0-7, Significant for Dysfunction: >8)  Normal Cognitive Function Screening: Yes    Immunization & Health Maintenance Record  There is no immunization history on file for this patient.  Health Maintenance  Topic Date Due  . TETANUS/TDAP  07/06/1969  . MAMMOGRAM  07/06/2000  . COLONOSCOPY  07/06/2000  . DEXA SCAN  07/07/2015  . OPHTHALMOLOGY EXAM  12/19/2017  . FOOT EXAM  05/03/2018  . INFLUENZA VACCINE  12/01/2018  . PNA vac Low Risk Adult (1 of 2 - PCV13) 02/06/2019 (Originally 07/07/2015)  . HEMOGLOBIN A1C  02/08/2019  . Hepatitis C Screening  Completed       Assessment  This is a routine wellness examination for Christy Love.  Health Maintenance: Due or Overdue Health Maintenance Due  Topic Date Due  . TETANUS/TDAP  07/06/1969  . MAMMOGRAM  07/06/2000  . COLONOSCOPY  07/06/2000  . DEXA SCAN  07/07/2015  . OPHTHALMOLOGY EXAM  12/19/2017  . FOOT EXAM  05/03/2018  . INFLUENZA VACCINE  12/01/2018    Christy Love does not need a referral for [employer] Assistance: Care Management:   no Social Work:    no Prescription Assistance:  no Nutrition/Diabetes Education:  no   Plan:  Personalized Goals Goals Addressed            This Visit's Progress   . DIET - INCREASE WATER INTAKE       Try to drink 6-8 glasses of water daily.      Personalized Health Maintenance & Screening Recommendations  pt declines all recommended health maintenance and screeing including colonoscopy, mammogram and all vaccines  Lung Cancer Screening Recommended: no (Low Dose CT Chest recommended if Age 85-80 years, 30 pack-year currently smoking OR have quit w/in past 15 years) Hepatitis C Screening recommended: no HIV Screening recommended: no  Advanced Directives: Written information was not prepared per patient's request.  Referrals & Orders No orders of the defined types were placed in this encounter.   Follow-up Plan . Follow-up with Dettinger, Elige Radon, MD as  planned   I have personally reviewed and noted the following in the patient's chart:   .  Medical and social history . Use of alcohol, tobacco or illicit drugs  . Current medications and supplements . Functional ability and status . Nutritional status . Physical activity . Advanced directives . List of other physicians . Hospitalizations, surgeries, and ER visits in previous 12 months . Vitals . Screenings to include cognitive, depression, and falls . Referrals and appointments  In addition, I have reviewed and discussed with Christy Love certain preventive protocols, quality metrics, and best practice recommendations. A written personalized care plan for preventive services as well as general preventive health recommendations is available and can be mailed to the patient at her request.      Christy Love,  G, LPN  8/2/95629/05/2018   I have reviewed and agree with the above AWV documentation.   Christy Rodneyhristy Hawks, FNP

## 2019-01-01 NOTE — Patient Instructions (Signed)
Preventive Care 68 Years and Older, Female Preventive care refers to lifestyle choices and visits with your health care provider that can promote health and wellness. This includes:  A yearly physical exam. This is also called an annual well check.  Regular dental and eye exams.  Immunizations.  Screening for certain conditions.  Healthy lifestyle choices, such as diet and exercise. What can I expect for my preventive care visit? Physical exam Your health care provider will check:  Height and weight. These may be used to calculate body mass index (BMI), which is a measurement that tells if you are at a healthy weight.  Heart rate and blood pressure.  Your skin for abnormal spots. Counseling Your health care provider may ask you questions about:  Alcohol, tobacco, and drug use.  Emotional well-being.  Home and relationship well-being.  Sexual activity.  Eating habits.  History of falls.  Memory and ability to understand (cognition).  Work and work Statistician.  Pregnancy and menstrual history. What immunizations do I need?  Influenza (flu) vaccine  This is recommended every year. Tetanus, diphtheria, and pertussis (Tdap) vaccine  You may need a Td booster every 10 years. Varicella (chickenpox) vaccine  You may need this vaccine if you have not already been vaccinated. Zoster (shingles) vaccine  You may need this after age 33. Pneumococcal conjugate (PCV13) vaccine  One dose is recommended after age 33. Pneumococcal polysaccharide (PPSV23) vaccine  One dose is recommended after age 72. Measles, mumps, and rubella (MMR) vaccine  You may need at least one dose of MMR if you were born in 1957 or later. You may also need a second dose. Meningococcal conjugate (MenACWY) vaccine  You may need this if you have certain conditions. Hepatitis A vaccine  You may need this if you have certain conditions or if you travel or work in places where you may be exposed  to hepatitis A. Hepatitis B vaccine  You may need this if you have certain conditions or if you travel or work in places where you may be exposed to hepatitis B. Haemophilus influenzae type b (Hib) vaccine  You may need this if you have certain conditions. You may receive vaccines as individual doses or as more than one vaccine together in one shot (combination vaccines). Talk with your health care provider about the risks and benefits of combination vaccines. What tests do I need? Blood tests  Lipid and cholesterol levels. These may be checked every 5 years, or more frequently depending on your overall health.  Hepatitis C test.  Hepatitis B test. Screening  Lung cancer screening. You may have this screening every year starting at age 39 if you have a 30-pack-year history of smoking and currently smoke or have quit within the past 15 years.  Colorectal cancer screening. All adults should have this screening starting at age 36 and continuing until age 15. Your health care provider may recommend screening at age 23 if you are at increased risk. You will have tests every 1-10 years, depending on your results and the type of screening test.  Diabetes screening. This is done by checking your blood sugar (glucose) after you have not eaten for a while (fasting). You may have this done every 1-3 years.  Mammogram. This may be done every 1-2 years. Talk with your health care provider about how often you should have regular mammograms.  BRCA-related cancer screening. This may be done if you have a family history of breast, ovarian, tubal, or peritoneal cancers.  Other tests  Sexually transmitted disease (STD) testing.  Bone density scan. This is done to screen for osteoporosis. You may have this done starting at age 76. Follow these instructions at home: Eating and drinking  Eat a diet that includes fresh fruits and vegetables, whole grains, lean protein, and low-fat dairy products. Limit  your intake of foods with high amounts of sugar, saturated fats, and salt.  Take vitamin and mineral supplements as recommended by your health care provider.  Do not drink alcohol if your health care provider tells you not to drink.  If you drink alcohol: ? Limit how much you have to 0-1 drink a day. ? Be aware of how much alcohol is in your drink. In the [place] one drink equals one 12 oz bottle of beer (355 mL), one 5 oz glass of wine (148 mL), or one 1 oz glass of hard liquor (44 mL). Lifestyle  Take daily care of your teeth and gums.  Stay active. Exercise for at least 30 minutes on 5 or more days each week.  Do not use any products that contain nicotine or tobacco, such as cigarettes, e-cigarettes, and chewing tobacco. If you need help quitting, ask your health care provider.  If you are sexually active, practice safe sex. Use a condom or other form of protection in order to prevent STIs (sexually transmitted infections).  Talk with your health care provider about taking a low-dose aspirin or statin. What's next?  Go to your health care provider once a year for a well check visit.  Ask your health care provider how often you should have your eyes and teeth checked.  Stay up to date on all vaccines. This information is not intended to replace advice given to you by your health care provider. Make sure you discuss any questions you have with your health care provider. Document Released: 05/15/2015 Document Revised: 04/12/2018 Document Reviewed: 04/12/2018 Elsevier Patient Education  2020 [employer].

## 2019-01-03 ENCOUNTER — Telehealth: Payer: Self-pay | Admitting: Family Medicine

## 2019-01-03 NOTE — Telephone Encounter (Signed)
Called patient, she has figured out what the phone call was about and no longer needs to speak with anyone.

## 2019-01-08 ENCOUNTER — Other Ambulatory Visit: Payer: Self-pay | Admitting: Family Medicine

## 2019-01-08 DIAGNOSIS — E1169 Type 2 diabetes mellitus with other specified complication: Secondary | ICD-10-CM

## 2019-02-12 ENCOUNTER — Other Ambulatory Visit: Payer: Self-pay | Admitting: Family Medicine

## 2019-02-12 DIAGNOSIS — E1169 Type 2 diabetes mellitus with other specified complication: Secondary | ICD-10-CM

## 2019-03-19 ENCOUNTER — Other Ambulatory Visit: Payer: Self-pay | Admitting: Family Medicine

## 2019-03-19 DIAGNOSIS — E1159 Type 2 diabetes mellitus with other circulatory complications: Secondary | ICD-10-CM

## 2019-03-27 ENCOUNTER — Other Ambulatory Visit: Payer: Self-pay | Admitting: Family Medicine

## 2019-03-27 DIAGNOSIS — E1169 Type 2 diabetes mellitus with other specified complication: Secondary | ICD-10-CM

## 2019-04-02 ENCOUNTER — Other Ambulatory Visit: Payer: Self-pay | Admitting: Family Medicine

## 2019-04-02 DIAGNOSIS — Z8673 Personal history of transient ischemic attack (TIA), and cerebral infarction without residual deficits: Secondary | ICD-10-CM

## 2019-04-12 ENCOUNTER — Other Ambulatory Visit: Payer: Self-pay | Admitting: Family Medicine

## 2019-04-12 DIAGNOSIS — E1169 Type 2 diabetes mellitus with other specified complication: Secondary | ICD-10-CM

## 2019-04-12 DIAGNOSIS — Z8673 Personal history of transient ischemic attack (TIA), and cerebral infarction without residual deficits: Secondary | ICD-10-CM

## 2019-04-23 ENCOUNTER — Other Ambulatory Visit: Payer: Self-pay

## 2019-04-24 ENCOUNTER — Ambulatory Visit (INDEPENDENT_AMBULATORY_CARE_PROVIDER_SITE_OTHER): Payer: PPO | Admitting: Family Medicine

## 2019-04-24 ENCOUNTER — Encounter: Payer: Self-pay | Admitting: Family Medicine

## 2019-04-24 VITALS — BP 132/81 | HR 101 | Temp 98.0°F | Ht 59.5 in | Wt 144.0 lb

## 2019-04-24 DIAGNOSIS — E785 Hyperlipidemia, unspecified: Secondary | ICD-10-CM | POA: Diagnosis not present

## 2019-04-24 DIAGNOSIS — E1169 Type 2 diabetes mellitus with other specified complication: Secondary | ICD-10-CM

## 2019-04-24 DIAGNOSIS — E1159 Type 2 diabetes mellitus with other circulatory complications: Secondary | ICD-10-CM | POA: Diagnosis not present

## 2019-04-24 DIAGNOSIS — K219 Gastro-esophageal reflux disease without esophagitis: Secondary | ICD-10-CM

## 2019-04-24 DIAGNOSIS — Z8673 Personal history of transient ischemic attack (TIA), and cerebral infarction without residual deficits: Secondary | ICD-10-CM

## 2019-04-24 DIAGNOSIS — R5383 Other fatigue: Secondary | ICD-10-CM

## 2019-04-24 DIAGNOSIS — I10 Essential (primary) hypertension: Secondary | ICD-10-CM | POA: Diagnosis not present

## 2019-04-24 DIAGNOSIS — N3281 Overactive bladder: Secondary | ICD-10-CM

## 2019-04-24 LAB — BAYER DCA HB A1C WAIVED: HB A1C (BAYER DCA - WAIVED): 5.8 % (ref <7.0)

## 2019-04-24 MED ORDER — MIRABEGRON ER 25 MG PO TB24: 25.0000 mg | ORAL_TABLET | Freq: Every day | ORAL | 2 refills | Status: DC

## 2019-04-24 MED ORDER — SIMVASTATIN 5 MG PO TABS: 5.0000 mg | ORAL_TABLET | Freq: Every day | ORAL | 3 refills | Status: AC

## 2019-04-24 MED ORDER — CLOPIDOGREL BISULFATE 75 MG PO TABS: 75.0000 mg | ORAL_TABLET | Freq: Every day | ORAL | 3 refills | Status: DC

## 2019-04-24 MED ORDER — METFORMIN HCL 1000 MG PO TABS: 1000.0000 mg | ORAL_TABLET | Freq: Two times a day (BID) | ORAL | 3 refills | Status: AC

## 2019-04-24 MED ORDER — AMLODIPINE BESYLATE 5 MG PO TABS: 5.0000 mg | ORAL_TABLET | Freq: Every day | ORAL | 3 refills | Status: DC

## 2019-04-24 MED ORDER — LISINOPRIL 10 MG PO TABS: 10.0000 mg | ORAL_TABLET | Freq: Every day | ORAL | 3 refills | Status: DC

## 2019-04-24 NOTE — Progress Notes (Signed)
BP 132/81   Pulse (!) 101   Temp 98 F (36.7 C) (Temporal)   Ht 4' 11.5" (1.511 m)   Wt 144 lb (65.3 kg)   SpO2 98%   BMI 28.60 kg/m    Subjective:   Patient ID: Christy Love, female    DOB: 1951/02/21, 68 y.o.   MRN: [id number]  HPI: Christy Love is a 68 y.o. female presenting on 04/24/2019 for Medical Management of Chronic Issues (wants to discuss right shoulder pain and hip, neck, Fatigue ) and Diabetes   HPI Type 2 diabetes mellitus Patient comes in today for recheck of his diabetes. Patient has been currently taking metformin. Patient is currently on an ACE inhibitor/ARB. Patient has not seen an ophthalmologist this year. Patient denies any issues with their feet.  A1c today is 5.8  Hypertension Patient is currently on lisinopril and amlodipine, and their blood pressure today is 132/81. Patient denies any lightheadedness or dizziness. Patient denies headaches, blurred vision, chest pains, shortness of breath, or weakness. Denies any side effects from medication and is content with current medication.   GERD Patient is currently on no medication currently we are monitoring and she has been doing fine symptom eyes.  She denies any major symptoms or abdominal pain or belching or burping. She denies any blood in her stool or lightheadedness or dizziness.   Hyperlipidemia Patient is coming in for recheck of his hyperlipidemia. The patient is currently taking simvastatin. They deny any issues with myalgias or history of liver damage from it. They deny any focal numbness or weakness or chest pain.   Patient has been having fatigue, she does admit that she wakes up multiple times a night and does have some urinary incontinence associated with it.  This is been something that she has been dealing with for quite some time but the fatigue is just been worsening.  She has never tried any medications for her bladder.  Relevant past medical, surgical, family and social history  reviewed and updated as indicated. Interim medical history since our last visit reviewed. Allergies and medications reviewed and updated.  Review of Systems  Constitutional: Positive for fatigue. Negative for chills and fever.  HENT: Negative for congestion, ear discharge and ear pain.   Eyes: Negative for redness and visual disturbance.  Respiratory: Negative for chest tightness and shortness of breath.   Cardiovascular: Negative for chest pain and leg swelling.  Gastrointestinal: Negative for abdominal pain.  Genitourinary: Positive for frequency. Negative for decreased urine volume, difficulty urinating, dysuria and urgency.  Musculoskeletal: Negative for back pain and gait problem.  Skin: Negative for rash.  Neurological: Negative for dizziness, light-headedness and headaches.  Psychiatric/Behavioral: Negative for agitation and behavioral problems.  All other systems reviewed and are negative.   Per HPI unless specifically indicated above   Allergies as of 04/24/2019   No Known Allergies     Medication List       Accurate as of April 24, 2019  1:25 PM. If you have any questions, ask your nurse or doctor.        amLODipine 5 MG tablet Commonly known as: NORVASC Take 1 tablet (5 mg total) by mouth daily. (Needs to be seen before next refill)   aspirin 81 MG chewable tablet Chew by mouth daily.   clopidogrel 75 MG tablet Commonly known as: PLAVIX Take 1 tablet (75 mg total) by mouth daily.   lisinopril 10 MG tablet Commonly known as: ZESTRIL Take 1 tablet (10  mg total) by mouth daily.   metFORMIN 1000 MG tablet Commonly known as: GLUCOPHAGE Take 1 tablet (1,000 mg total) by mouth 2 (two) times daily with a meal. What changed: See the new instructions. Changed by: Worthy Rancher, MD   mirabegron ER 25 MG Tb24 tablet Commonly known as: Myrbetriq Take 1 tablet (25 mg total) by mouth daily. Started by: Worthy Rancher, MD   simvastatin 5 MG  tablet Commonly known as: ZOCOR Take 1 tablet (5 mg total) by mouth daily.   VITAMIN B COMPLEX PO Take 1 tablet by mouth daily.   VITAMIN D PO Take 2,000 Units by mouth daily.        Objective:   BP 132/81   Pulse (!) 101   Temp 98 F (36.7 C) (Temporal)   Ht 4' 11.5" (1.511 m)   Wt 144 lb (65.3 kg)   SpO2 98%   BMI 28.60 kg/m   Wt Readings from Last 3 Encounters:  04/24/19 144 lb (65.3 kg)  10/31/18 146 lb 8 oz (66.5 kg)  08/09/18 143 lb 3.2 oz (65 kg)    Physical Exam Vitals and nursing note reviewed.  Constitutional:      General: She is not in acute distress.    Appearance: She is well-developed. She is not diaphoretic.  Eyes:     Conjunctiva/sclera: Conjunctivae normal.  Cardiovascular:     Rate and Rhythm: Normal rate and regular rhythm.     Heart sounds: Normal heart sounds. No murmur.  Pulmonary:     Effort: Pulmonary effort is normal. No respiratory distress.     Breath sounds: Normal breath sounds. No wheezing.  Abdominal:     General: Abdomen is flat. Bowel sounds are normal. There is no distension.     Tenderness: There is no abdominal tenderness.  Musculoskeletal:        General: No tenderness. Normal range of motion.  Skin:    General: Skin is warm and dry.     Findings: No rash.  Neurological:     Mental Status: She is alert and oriented to person, place, and time.     Coordination: Coordination normal.  Psychiatric:        Behavior: Behavior normal.      Assessment & Plan:   Problem List Items Addressed This Visit      Cardiovascular and Mediastinum   Hypertension associated with diabetes ([place])   Relevant Medications   simvastatin (ZOCOR) 5 MG tablet   amLODipine (NORVASC) 5 MG tablet   lisinopril (ZESTRIL) 10 MG tablet   metFORMIN (GLUCOPHAGE) 1000 MG tablet   Other Relevant Orders   CMP14+EGFR     Digestive   GERD (gastroesophageal reflux disease)   Relevant Orders   CBC with Differential/Platelet     Endocrine   Type 2  diabetes mellitus ([facility]) - Primary   Relevant Medications   simvastatin (ZOCOR) 5 MG tablet   lisinopril (ZESTRIL) 10 MG tablet   metFORMIN (GLUCOPHAGE) 1000 MG tablet   Other Relevant Orders   hgba1c   Microalbumin / creatinine urine ratio   CMP14+EGFR     Other   History of TIA (transient ischemic attack)   Relevant Medications   clopidogrel (PLAVIX) 75 MG tablet   simvastatin (ZOCOR) 5 MG tablet   Hyperlipidemia   Relevant Medications   simvastatin (ZOCOR) 5 MG tablet   amLODipine (NORVASC) 5 MG tablet   lisinopril (ZESTRIL) 10 MG tablet   Other Relevant Orders   Lipid panel  Fatigue   Relevant Orders   CBC with Differential/Platelet   TSH    Other Visit Diagnoses    OAB (overactive bladder)       Relevant Medications   mirabegron ER (MYRBETRIQ) 25 MG TB24 tablet    Will start Myrbetriq for her bladder, her A1c is 5.8 so we will discuss whether or not to continue Metformin at the current dose or maybe start cutting it in half and take 500 twice a day  Follow up plan: Return in about 3 months (around 07/23/2019), or if symptoms worsen or fail to improve.  Counseling provided for all of the vaccine components Orders Placed This Encounter  Procedures  . hgba1c  . Microalbumin / creatinine urine ratio  . CBC with Differential/Platelet  . CMP14+EGFR  . Lipid panel  . TSH    Caryl Pina, MD [facility] 04/24/2019, 1:25 PM

## 2019-04-25 LAB — CBC WITH DIFFERENTIAL/PLATELET
Basophils Absolute: 0 10*3/uL (ref 0.0–0.2)
Basos: 1 %
EOS (ABSOLUTE): 0.1 10*3/uL (ref 0.0–0.4)
Eos: 1 %
Hematocrit: 38.8 % (ref 34.0–46.6)
Hemoglobin: 12.9 g/dL (ref 11.1–15.9)
Immature Grans (Abs): 0 10*3/uL (ref 0.0–0.1)
Immature Granulocytes: 0 %
Lymphocytes Absolute: 2 10*3/uL (ref 0.7–3.1)
Lymphs: 33 %
MCH: 27 pg (ref 26.6–33.0)
MCHC: 33.2 g/dL (ref 31.5–35.7)
MCV: 81 fL (ref 79–97)
Monocytes Absolute: 0.4 10*3/uL (ref 0.1–0.9)
Monocytes: 7 %
Neutrophils Absolute: 3.4 10*3/uL (ref 1.4–7.0)
Neutrophils: 58 %
Platelets: 347 10*3/uL (ref 150–450)
RBC: 4.77 x10E6/uL (ref 3.77–5.28)
RDW: 13.9 % (ref 11.7–15.4)
WBC: 5.9 10*3/uL (ref 3.4–10.8)

## 2019-04-25 LAB — LIPID PANEL
Chol/HDL Ratio: 2.6 ratio (ref 0.0–4.4)
Cholesterol, Total: 163 mg/dL (ref 100–199)
HDL: 62 mg/dL (ref >39)
LDL Chol Calc (NIH): 80 mg/dL (ref 0–99)
Triglycerides: 117 mg/dL (ref 0–149)
VLDL Cholesterol Cal: 21 mg/dL (ref 5–40)

## 2019-04-25 LAB — CMP14+EGFR
ALT: 17 IU/L (ref 0–32)
AST: 20 IU/L (ref 0–40)
Albumin/Globulin Ratio: 1.7 (ref 1.2–2.2)
Albumin: 4.8 g/dL (ref 3.8–4.8)
Alkaline Phosphatase: 53 IU/L (ref 39–117)
BUN/Creatinine Ratio: 12 (ref 12–28)
BUN: 11 mg/dL (ref 8–27)
Bilirubin Total: 0.2 mg/dL (ref 0.0–1.2)
CO2: 23 mmol/L (ref 20–29)
Calcium: 10.4 mg/dL — ABNORMAL HIGH (ref 8.7–10.3)
Chloride: 101 mmol/L (ref 96–106)
Creatinine, Ser: 0.95 mg/dL (ref 0.57–1.00)
GFR calc Af Amer: 71 mL/min/{1.73_m2} (ref >59)
GFR calc non Af Amer: 62 mL/min/{1.73_m2} (ref >59)
Globulin, Total: 2.9 g/dL (ref 1.5–4.5)
Glucose: 129 mg/dL — ABNORMAL HIGH (ref 65–99)
Potassium: 4.2 mmol/L (ref 3.5–5.2)
Sodium: 142 mmol/L (ref 134–144)
Total Protein: 7.7 g/dL (ref 6.0–8.5)

## 2019-04-25 LAB — TSH: TSH: 0.982 u[IU]/mL (ref 0.450–4.500)

## 2019-05-13 ENCOUNTER — Other Ambulatory Visit: Payer: Self-pay | Admitting: Family Medicine

## 2019-05-13 DIAGNOSIS — E1159 Type 2 diabetes mellitus with other circulatory complications: Secondary | ICD-10-CM

## 2019-05-13 DIAGNOSIS — I152 Hypertension secondary to endocrine disorders: Secondary | ICD-10-CM

## 2019-06-13 ENCOUNTER — Encounter: Payer: Self-pay | Admitting: Family Medicine

## 2019-06-13 ENCOUNTER — Ambulatory Visit (INDEPENDENT_AMBULATORY_CARE_PROVIDER_SITE_OTHER): Payer: PPO | Admitting: Family Medicine

## 2019-06-13 DIAGNOSIS — R531 Weakness: Secondary | ICD-10-CM | POA: Diagnosis not present

## 2019-06-13 DIAGNOSIS — R2689 Other abnormalities of gait and mobility: Secondary | ICD-10-CM

## 2019-06-13 DIAGNOSIS — Z9181 History of falling: Secondary | ICD-10-CM | POA: Diagnosis not present

## 2019-06-13 NOTE — Progress Notes (Signed)
Virtual Visit via telephone Note  I connected with Christy Love on 06/13/19 at 1724 by telephone and verified that I am speaking with the correct person using two identifiers. Christy Love is currently located at home and no other people are currently with her during visit. The provider, Fransisca Kaufmann Latoia Eyster, MD is located in their office at time of visit.  Call ended at 1734  I discussed the limitations, risks, security and privacy concerns of performing an evaluation and management service by telephone and the availability of in person appointments. I also discussed with the patient that there may be a patient responsible charge related to this service. The patient expressed understanding and agreed to proceed.   History and Present Illness: Patient is calling in for difficulties walking and she is having trouble getting up and is worsening.  She is having balance issues increased.  She has not had falls but is concerned she might.  She feels weak in both lower extremities.  She denies any trauma. She feels like it is worse over the past 2 months. She is mostly stuck at home during covid. She says her blood sugar is running ok.  She denies any stroke symptoms  1. Weakness generalized   2. At moderate risk for fall   3. Balance disorder     Outpatient Encounter Medications as of 06/13/2019  Medication Sig  . amLODipine (NORVASC) 5 MG tablet TAKE 1 TABLET(5 MG) BY MOUTH DAILY  . B Complex Vitamins (VITAMIN B COMPLEX PO) Take 1 tablet by mouth daily.  . clopidogrel (PLAVIX) 75 MG tablet Take 1 tablet (75 mg total) by mouth daily.  Marland Kitchen lisinopril (ZESTRIL) 10 MG tablet Take 1 tablet (10 mg total) by mouth daily.  . metFORMIN (GLUCOPHAGE) 1000 MG tablet Take 1 tablet (1,000 mg total) by mouth 2 (two) times daily with a meal.  . mirabegron ER (MYRBETRIQ) 25 MG TB24 tablet Take 1 tablet (25 mg total) by mouth daily.  . simvastatin (ZOCOR) 5 MG tablet Take 1 tablet (5 mg total) by mouth  daily.  Marland Kitchen VITAMIN D PO Take 2,000 Units by mouth daily.   No facility-administered encounter medications on file as of 06/13/2019.    Review of Systems  Constitutional: Negative for chills and fever.  Eyes: Negative for visual disturbance.  Respiratory: Negative for chest tightness and shortness of breath.   Cardiovascular: Negative for chest pain and leg swelling.  Musculoskeletal: Positive for gait problem. Negative for arthralgias and back pain.  Skin: Negative for rash.  Neurological: Positive for weakness. Negative for light-headedness, numbness and headaches.  Psychiatric/Behavioral: Negative for agitation and behavioral problems.  All other systems reviewed and are negative.   Observations/Objective: Patient sounds comfortable and in no acute distress  Assessment and Plan: Problem List Items Addressed This Visit    None    Visit Diagnoses    Weakness generalized    -  Primary   Relevant Orders   Ambulatory referral to Physical Therapy   At moderate risk for fall       Relevant Orders   Ambulatory referral to Physical Therapy   Balance disorder       Relevant Orders   Ambulatory referral to Physical Therapy      Will do physical therapy referral, concerned that she is just developing weakness because of decreased mobility. Follow up plan: Return if symptoms worsen or fail to improve.     I discussed the assessment and treatment plan with the  patient. The patient was provided an opportunity to ask questions and all were answered. The patient agreed with the plan and demonstrated an understanding of the instructions.   The patient was advised to call back or seek an in-person evaluation if the symptoms worsen or if the condition fails to improve as anticipated.  The above assessment and management plan was discussed with the patient. The patient verbalized understanding of and has agreed to the management plan. Patient is aware to call the clinic if symptoms persist  or worsen. Patient is aware when to return to the clinic for a follow-up visit. Patient educated on when it is appropriate to go to the emergency department.    I provided 10 minutes of non-face-to-face time during this encounter.    Nils Pyle, MD

## 2019-06-19 DIAGNOSIS — R531 Weakness: Secondary | ICD-10-CM | POA: Diagnosis not present

## 2019-06-19 DIAGNOSIS — R2689 Other abnormalities of gait and mobility: Secondary | ICD-10-CM | POA: Diagnosis not present

## 2019-06-19 DIAGNOSIS — Z9181 History of falling: Secondary | ICD-10-CM | POA: Diagnosis not present

## 2019-07-13 ENCOUNTER — Other Ambulatory Visit: Payer: Self-pay | Admitting: Family Medicine

## 2019-07-13 DIAGNOSIS — Z8673 Personal history of transient ischemic attack (TIA), and cerebral infarction without residual deficits: Secondary | ICD-10-CM

## 2019-07-18 ENCOUNTER — Telehealth: Payer: Self-pay | Admitting: Family Medicine

## 2019-07-18 NOTE — Chronic Care Management (AMB) (Signed)
°  Care Management   Note  07/18/2019 Name: Christy Love MRN: [id number] DOB: 1951/01/24  Christy Love is a 69 y.o. year old female who is a primary care patient of Dettinger, Elige Radon, MD and is actively engaged with the care management team. I reached out to Christy Love by phone today to assist with scheduling a follow up visit with the RN Case Manager  Follow up plan: Unsuccessful telephone outreach attempt made. A HIPPA compliant phone message was left for the patient providing contact information and requesting a return call.  The care management team will reach out to the patient again over the next 7 days.  If patient returns call to provider office, please advise to call Embedded Care Management Care Guide Penne Lash  at [phone]  Penne Lash, RMA Care Guide, Embedded Care Coordination [facility]  [place], [place] [address] Direct Dial: [phone] [email or web address] Website: [email or web address]

## 2019-07-19 ENCOUNTER — Other Ambulatory Visit: Payer: Self-pay | Admitting: Family Medicine

## 2019-07-19 DIAGNOSIS — E1169 Type 2 diabetes mellitus with other specified complication: Secondary | ICD-10-CM

## 2019-07-22 NOTE — Chronic Care Management (AMB) (Signed)
  Care Management   Note  07/22/2019 Name: Kyrene Longan. Mcquilkin MRN: [id number] DOB: 11/27/1950  Jewelz B. Gerhart is a 69 y.o. year old female who is a primary care patient of Dettinger, Elige Radon, MD and is actively engaged with the care management team. I reached out to Vivianna B. Shieh by phone today to assist with scheduling a follow up visit with the RN Case Manager  Follow up plan: Patient declines further follow up and engagement by the care management team. Appropriate care team members and provider have been notified via electronic communication.   Penne Lash, RMA Care Guide, Embedded Care Coordination [facility]  [place], [place] [address] Direct Dial: [phone] [email or web address] Website: [email or web address]

## 2019-07-23 ENCOUNTER — Ambulatory Visit: Payer: Self-pay | Admitting: *Deleted

## 2019-07-23 DIAGNOSIS — E1169 Type 2 diabetes mellitus with other specified complication: Secondary | ICD-10-CM

## 2019-07-23 DIAGNOSIS — E1159 Type 2 diabetes mellitus with other circulatory complications: Secondary | ICD-10-CM

## 2019-07-23 NOTE — Chronic Care Management (AMB) (Signed)
  Chronic Care Management   Note  07/23/2019 Name: Christy Love MRN: [id number] DOB: November 20, 1950  [name] wishes to discontinue CCM services. Refer to CCM telephone note from 07/22/19.   Follow up plan: No further follow up required: The CCM team will remain available to provide services as desired  Demetrios Loll, BSN, RN-BC Embedded Chronic Care Manager [facility] Care Management Direct Dial: [phone]

## 2019-07-24 ENCOUNTER — Ambulatory Visit: Payer: PPO | Admitting: Family Medicine

## 2019-07-29 ENCOUNTER — Encounter: Payer: Self-pay | Admitting: *Deleted

## 2019-08-27 IMAGING — US US CAROTID DUPLEX BILAT
1 series · 13 of 24 positions shown · non-contrast
Comparison: CT scan of the head 01/27/2017

CLINICAL DATA: 66-year-old female with symptoms of transient
ischemic attack

EXAM:
BILATERAL CAROTID DUPLEX ULTRASOUND
TECHNIQUE: Gray scale imaging, color Doppler and duplex ultrasound were
performed of bilateral carotid and vertebral arteries in the neck.

[Series 1: us carotid duplex bilat · 0.06mm/px · 13 of 96 slices shown]
[im 1/96]
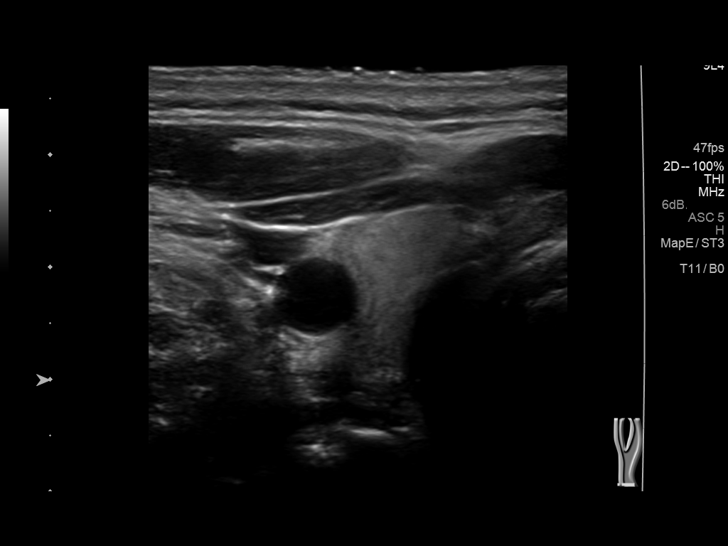
[im 9/96]
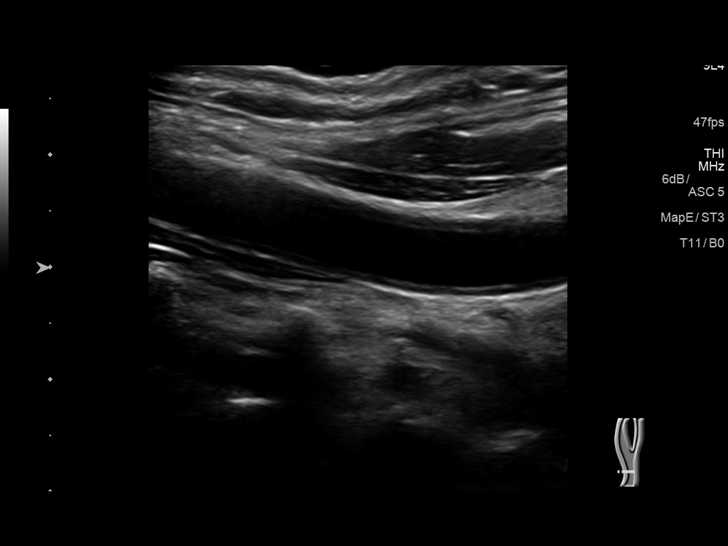
[im 17/96]
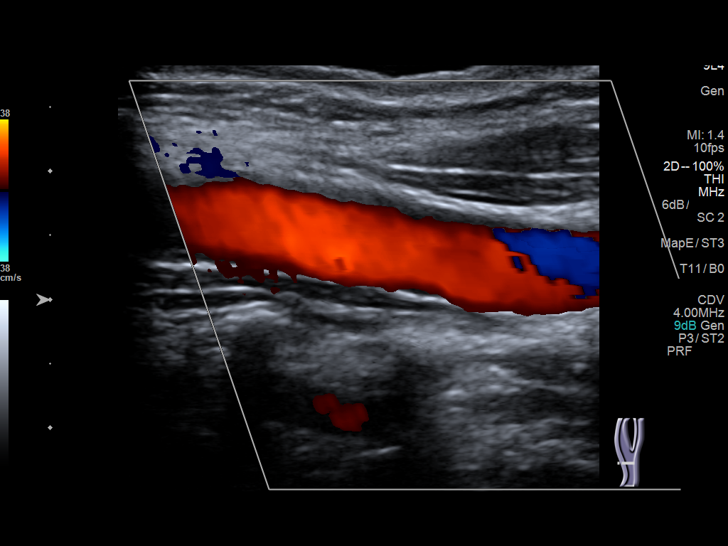
[im 25/96]
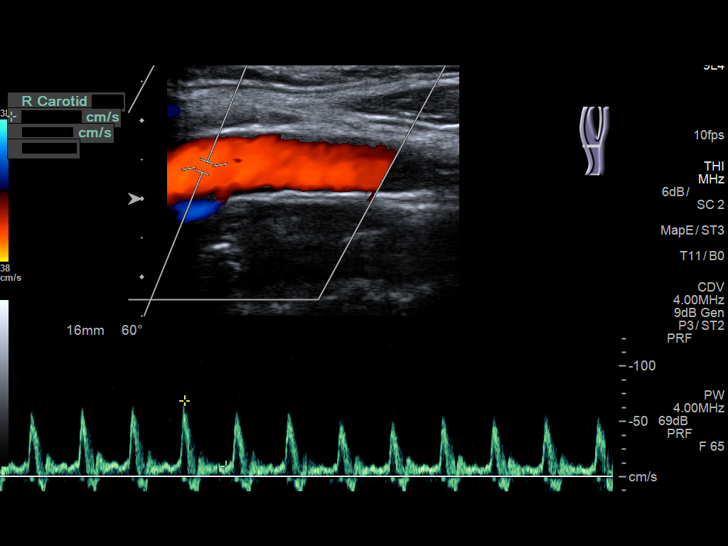
[im 34/96]
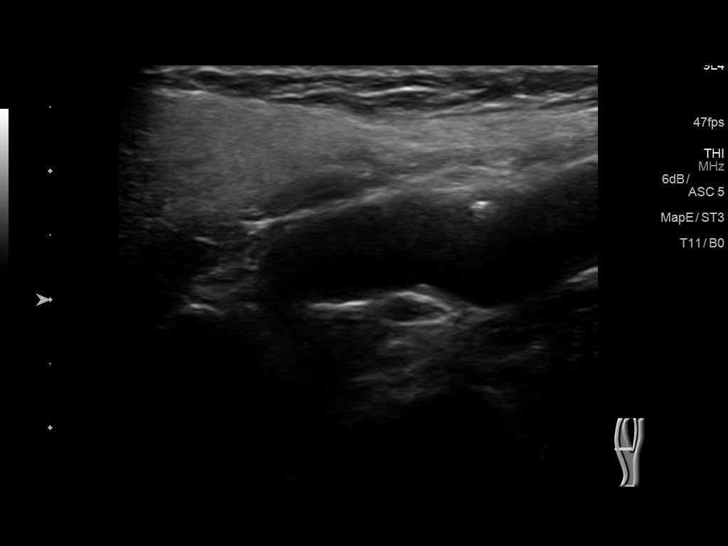
[im 42/96]
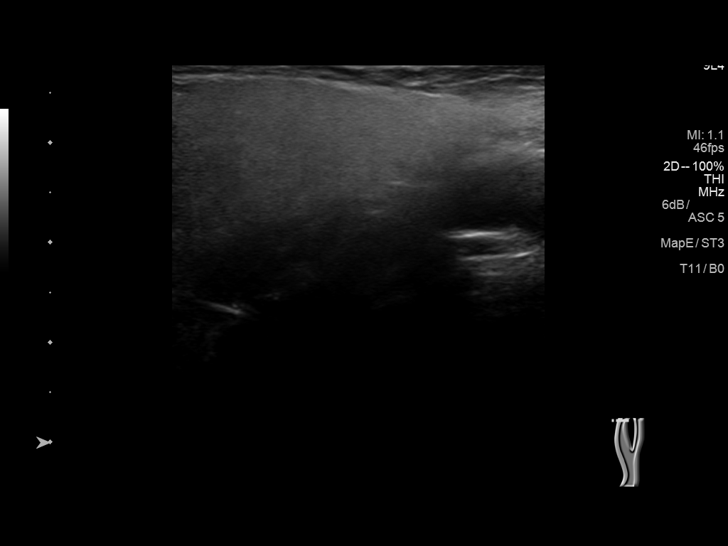
[im 50/96]
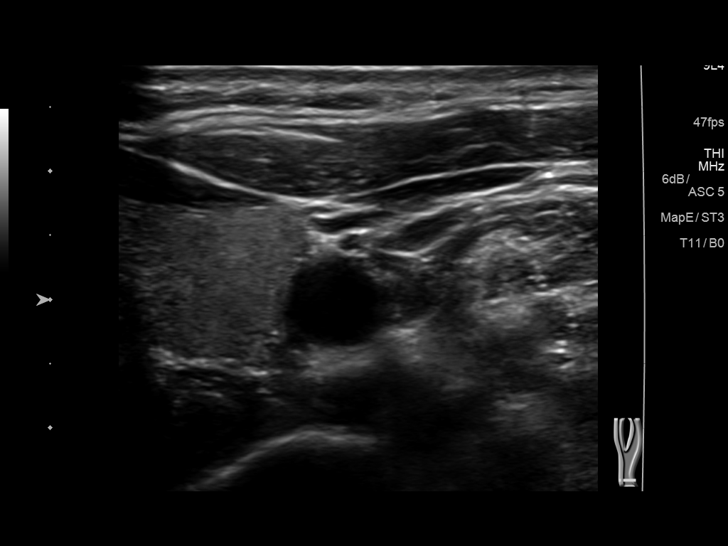
[im 54/96]
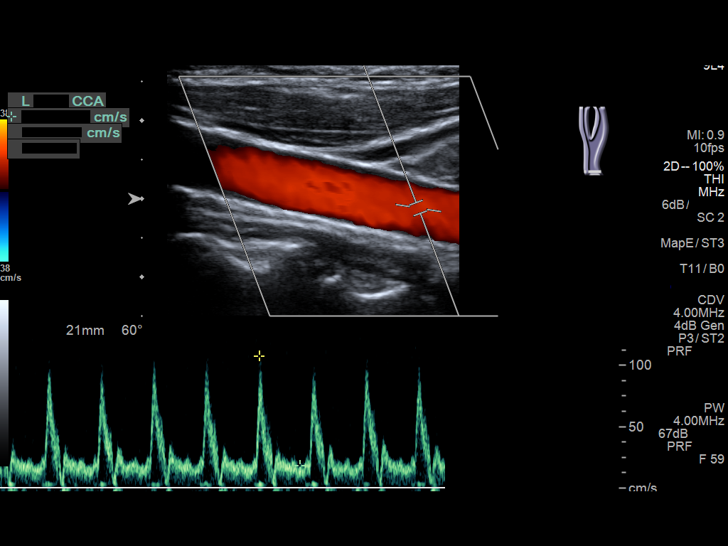
[im 62/96]
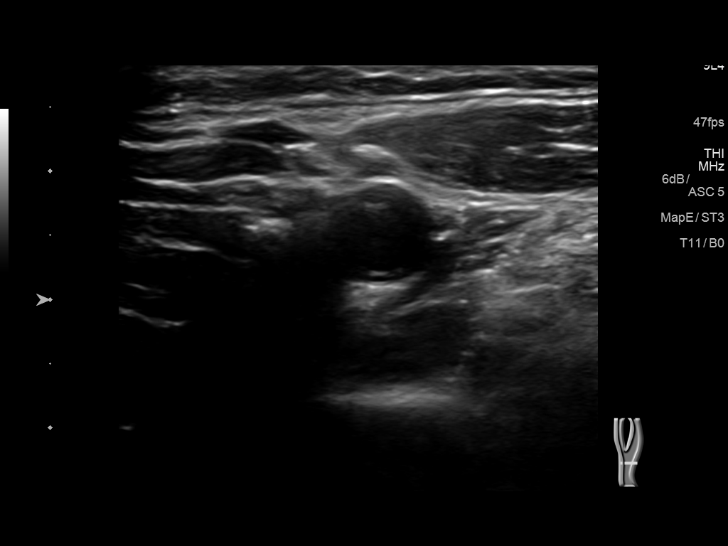
[im 71/96]
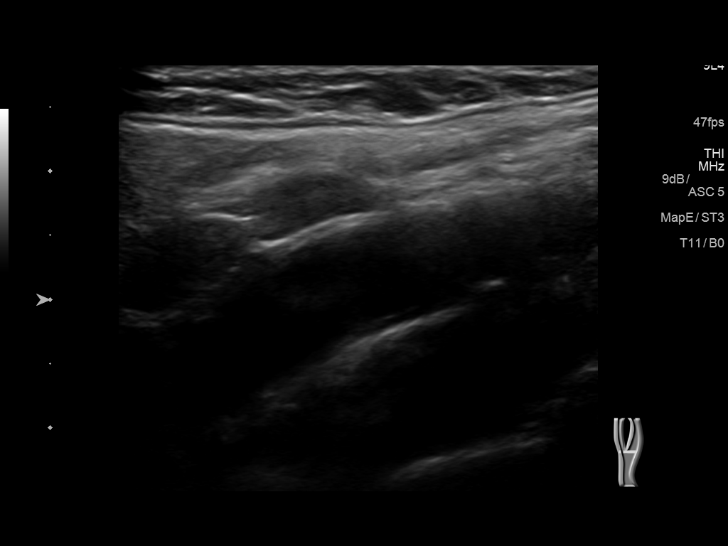
[im 79/96]
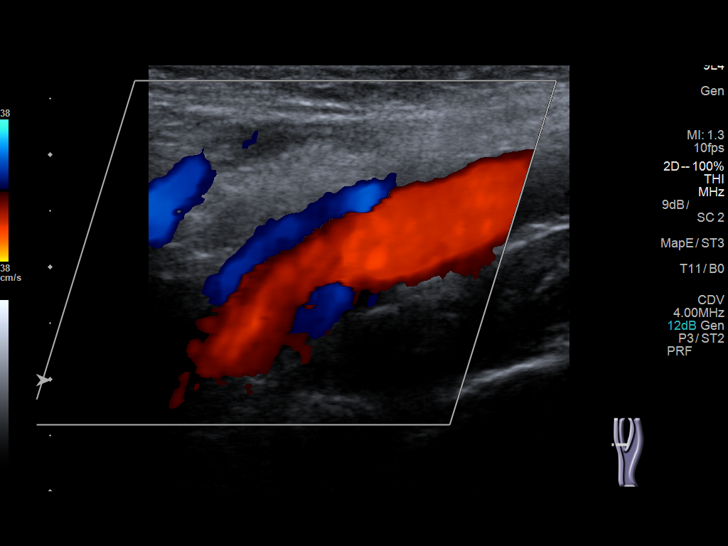
[im 87/96]
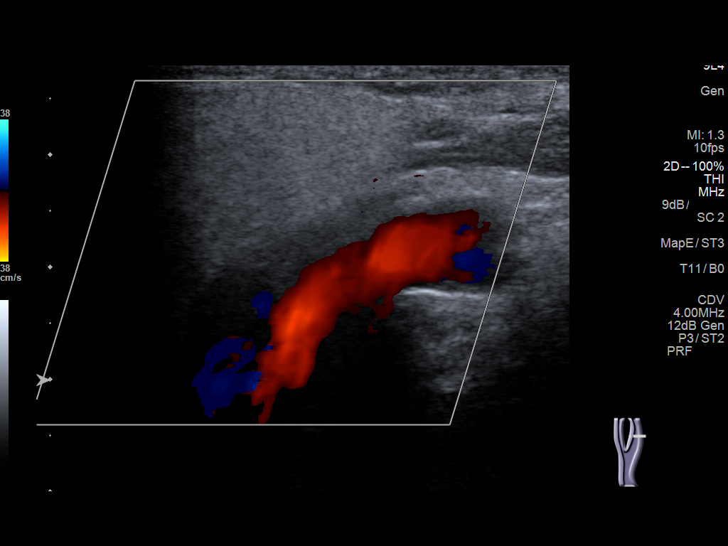
[im 96/96]
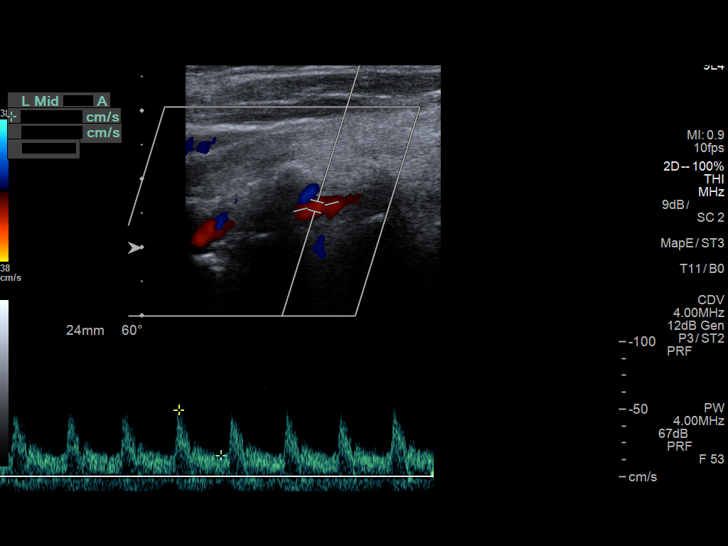

[13 of 24 positions shown; findings below may reference images not displayed]

FINDINGS: Criteria: Quantification of carotid stenosis is based on velocity
parameters that correlate the residual internal carotid diameter
with NASCET-based stenosis levels, using the diameter of the distal
internal carotid lumen as the denominator for stenosis measurement.

The following velocity measurements were obtained:

RIGHT

ICA:  42/17 cm/sec

CCA:  107/12 cm/sec

SYSTOLIC ICA/CCA RATIO:

DIASTOLIC ICA/CCA RATIO:

ECA:  67 cm/sec

LEFT

ICA:  53/23 cm/sec

CCA:  118/25 cm/sec

SYSTOLIC ICA/CCA RATIO:

DIASTOLIC ICA/CCA RATIO:

ECA:  31 cm/sec

RIGHT CAROTID ARTERY: No significant atherosclerotic plaque or
evidence of stenosis.

RIGHT VERTEBRAL ARTERY:  Patent with normal antegrade flow.

LEFT CAROTID ARTERY: No significant atherosclerotic plaque or
evidence of stenosis.

LEFT VERTEBRAL ARTERY:  Patent with normal antegrade flow.
IMPRESSION: Negative bilateral carotid duplex ultrasound.

## 2019-09-05 ENCOUNTER — Ambulatory Visit (INDEPENDENT_AMBULATORY_CARE_PROVIDER_SITE_OTHER): Payer: PPO | Admitting: Family Medicine

## 2019-09-05 ENCOUNTER — Encounter: Payer: Self-pay | Admitting: Family Medicine

## 2019-09-05 ENCOUNTER — Other Ambulatory Visit: Payer: Self-pay

## 2019-09-05 VITALS — BP 131/71 | HR 83 | Temp 98.3°F | Ht 59.5 in | Wt 149.2 lb

## 2019-09-05 DIAGNOSIS — I10 Essential (primary) hypertension: Secondary | ICD-10-CM

## 2019-09-05 DIAGNOSIS — E1159 Type 2 diabetes mellitus with other circulatory complications: Secondary | ICD-10-CM | POA: Diagnosis not present

## 2019-09-05 DIAGNOSIS — E1169 Type 2 diabetes mellitus with other specified complication: Secondary | ICD-10-CM | POA: Diagnosis not present

## 2019-09-05 DIAGNOSIS — K219 Gastro-esophageal reflux disease without esophagitis: Secondary | ICD-10-CM

## 2019-09-05 DIAGNOSIS — E785 Hyperlipidemia, unspecified: Secondary | ICD-10-CM | POA: Diagnosis not present

## 2019-09-05 LAB — BAYER DCA HB A1C WAIVED: HB A1C (BAYER DCA - WAIVED): 6.7 % (ref <7.0)

## 2019-09-05 NOTE — Progress Notes (Signed)
BP 131/71   Pulse 83   Temp 98.3 F (36.8 C) (Temporal)   Ht 4' 11.5" (1.511 m)   Wt 149 lb 4 oz (67.7 kg)   BMI 29.64 kg/m    Subjective:   Patient ID: Christy Love, female    DOB: [birth year], 69 y.o.   MRN: [id number]  HPI: Victorian B. Schnetzer is a 69 y.o. female presenting on 09/05/2019 for Medical Management of Chronic Issues   HPI Type 2 diabetes mellitus Patient comes in today for recheck of his diabetes. Patient has been currently taking metformin. Patient is currently on an ACE inhibitor/ARB. Patient has not seen an ophthalmologist this year. Patient denies any issues with their feet. The symptom started onset as an adult hypertension and hyperlipidemia and GERD ARE RELATED TO DM   Hyperlipidemia Patient is coming in for recheck of his hyperlipidemia. The patient is currently taking simvastatin. They deny any issues with myalgias or history of liver damage from it. They deny any focal numbness or weakness or chest pain.   Hypertension Patient is currently on lisinopril and amlodipine, and their blood pressure today is 131/71. Patient denies any lightheadedness or dizziness. Patient denies headaches, blurred vision, chest pains, shortness of breath, or weakness. Denies any side effects from medication and is content with current medication.   GERD Patient is currently on no medication currently.  She denies any major symptoms or abdominal pain or belching or burping. She denies any blood in her stool or lightheadedness or dizziness.   Relevant past medical, surgical, family and social history reviewed and updated as indicated. Interim medical history since our last visit reviewed. Allergies and medications reviewed and updated.  Review of Systems  Constitutional: Negative for chills and fever.  Eyes: Negative for visual disturbance.  Respiratory: Negative for chest tightness and shortness of breath.   Cardiovascular: Negative for chest pain and leg swelling.   Musculoskeletal: Negative for back pain and gait problem.  Skin: Negative for rash.  Neurological: Negative for light-headedness and headaches.  Psychiatric/Behavioral: Negative for agitation and behavioral problems.  All other systems reviewed and are negative.   Per HPI unless specifically indicated above   Allergies as of 09/05/2019   No Known Allergies     Medication List       Accurate as of Sep 05, 2019 12:27 PM. If you have any questions, ask your nurse or doctor.        STOP taking these medications   mirabegron ER 25 MG Tb24 tablet Commonly known as: Myrbetriq Stopped by: Fransisca Kaufmann Dettinger, MD     TAKE these medications   amLODipine 5 MG tablet Commonly known as: NORVASC TAKE 1 TABLET(5 MG) BY MOUTH DAILY   clopidogrel 75 MG tablet Commonly known as: PLAVIX Take 1 tablet by mouth once daily   lisinopril 10 MG tablet Commonly known as: ZESTRIL Take 1 tablet by mouth once daily   metFORMIN 1000 MG tablet Commonly known as: GLUCOPHAGE Take 1 tablet (1,000 mg total) by mouth 2 (two) times daily with a meal.   simvastatin 5 MG tablet Commonly known as: ZOCOR Take 1 tablet (5 mg total) by mouth daily.   VITAMIN B COMPLEX PO Take 1 tablet by mouth daily.   VITAMIN D PO Take 2,000 Units by mouth daily.        Objective:   BP 131/71   Pulse 83   Temp 98.3 F (36.8 C) (Temporal)   Ht 4' 11.5" (1.511 m)  Wt 149 lb 4 oz (67.7 kg)   BMI 29.64 kg/m   Wt Readings from Last 3 Encounters:  09/05/19 149 lb 4 oz (67.7 kg)  04/24/19 144 lb (65.3 kg)  10/31/18 146 lb 8 oz (66.5 kg)    Physical Exam Vitals and nursing note reviewed.  Constitutional:      General: She is not in acute distress.    Appearance: She is well-developed. She is not diaphoretic.  Eyes:     Conjunctiva/sclera: Conjunctivae normal.  Cardiovascular:     Rate and Rhythm: Normal rate and regular rhythm.     Heart sounds: Normal heart sounds. No murmur.  Pulmonary:     Effort:  Pulmonary effort is normal. No respiratory distress.     Breath sounds: Normal breath sounds. No wheezing.  Musculoskeletal:        General: No tenderness. Normal range of motion.  Skin:    General: Skin is warm and dry.     Findings: No rash.  Neurological:     Mental Status: She is alert and oriented to person, place, and time.     Coordination: Coordination normal.  Psychiatric:        Behavior: Behavior normal.       Assessment & Plan:   Problem List Items Addressed This Visit      Cardiovascular and Mediastinum   Hypertension associated with diabetes ([place])   Relevant Orders   CMP14+EGFR (Completed)     Digestive   GERD (gastroesophageal reflux disease)     Endocrine   Type 2 diabetes mellitus ([place]) - Primary   Relevant Orders   Bayer DCA Hb A1c Waived (Completed)   CMP14+EGFR (Completed)   Lipid panel (Completed)     Other   Hyperlipidemia   Relevant Orders   Lipid panel (Completed)      Continue current medication, no changes, will check blood work. Follow up plan: Return in about 3 months (around 12/06/2019), or if symptoms worsen or fail to improve, for Can schedule if wants to but is moving so she may not come back..  Counseling provided for all of the vaccine components Orders Placed This Encounter  Procedures  . Bayer [facility] Hb A1c [place], MD [facility] 09/05/2019, 12:27 PM

## 2019-09-06 LAB — CMP14+EGFR
ALT: 13 IU/L (ref 0–32)
AST: 17 IU/L (ref 0–40)
Albumin/Globulin Ratio: 1.5 (ref 1.2–2.2)
Albumin: 4.6 g/dL (ref 3.8–4.8)
Alkaline Phosphatase: 42 IU/L (ref 39–117)
BUN/Creatinine Ratio: 8 — ABNORMAL LOW (ref 12–28)
BUN: 8 mg/dL (ref 8–27)
Bilirubin Total: <0.2 mg/dL (ref 0.0–1.2)
CO2: 26 mmol/L (ref 20–29)
Calcium: 9.9 mg/dL (ref 8.7–10.3)
Chloride: 107 mmol/L — ABNORMAL HIGH (ref 96–106)
Creatinine, Ser: 1.02 mg/dL — ABNORMAL HIGH (ref 0.57–1.00)
GFR calc Af Amer: 65 mL/min/{1.73_m2} (ref >59)
GFR calc non Af Amer: 56 mL/min/{1.73_m2} — ABNORMAL LOW (ref >59)
Globulin, Total: 3.1 g/dL (ref 1.5–4.5)
Glucose: 80 mg/dL (ref 65–99)
Potassium: 4.3 mmol/L (ref 3.5–5.2)
Sodium: 147 mmol/L — ABNORMAL HIGH (ref 134–144)
Total Protein: 7.7 g/dL (ref 6.0–8.5)

## 2019-09-06 LAB — LIPID PANEL
Chol/HDL Ratio: 2.3 ratio (ref 0.0–4.4)
Cholesterol, Total: 137 mg/dL (ref 100–199)
HDL: 60 mg/dL (ref >39)
LDL Chol Calc (NIH): 58 mg/dL (ref 0–99)
Triglycerides: 104 mg/dL (ref 0–149)
VLDL Cholesterol Cal: 19 mg/dL (ref 5–40)
